# Patient Record
Sex: Female | Born: 1955 | Race: White | Hispanic: No | Marital: Married | Smoking: Never smoker
Health system: Southern US, Community
[De-identification: ages and names within clinical notes are randomized; demographics above are authoritative.]

## PROBLEM LIST (undated history)

## (undated) DIAGNOSIS — C801 Malignant (primary) neoplasm, unspecified: Secondary | ICD-10-CM

## (undated) DIAGNOSIS — D329 Benign neoplasm of meninges, unspecified: Secondary | ICD-10-CM

## (undated) HISTORY — PX: BREAST BIOPSY: SHX20

## (undated) HISTORY — DX: Benign neoplasm of meninges, unspecified: D32.9

## (undated) HISTORY — PX: OTHER SURGICAL HISTORY: SHX169

---

## 1988-12-16 HISTORY — PX: BREAST SURGERY: SHX581

## 2005-01-31 ENCOUNTER — Ambulatory Visit: Payer: Self-pay | Admitting: Internal Medicine

## 2006-03-12 ENCOUNTER — Ambulatory Visit: Payer: Self-pay | Admitting: Internal Medicine

## 2006-04-04 ENCOUNTER — Ambulatory Visit: Payer: Self-pay | Admitting: Obstetrics and Gynecology

## 2007-05-19 ENCOUNTER — Ambulatory Visit: Payer: Self-pay | Admitting: Internal Medicine

## 2007-05-28 ENCOUNTER — Ambulatory Visit: Payer: Self-pay | Admitting: Internal Medicine

## 2008-06-07 ENCOUNTER — Ambulatory Visit: Payer: Self-pay | Admitting: Internal Medicine

## 2008-07-27 ENCOUNTER — Ambulatory Visit: Payer: Self-pay | Admitting: Unknown Physician Specialty

## 2008-12-16 HISTORY — PX: OTHER SURGICAL HISTORY: SHX169

## 2009-06-28 ENCOUNTER — Ambulatory Visit: Payer: Self-pay | Admitting: Internal Medicine

## 2009-06-30 ENCOUNTER — Ambulatory Visit: Payer: Self-pay | Admitting: Obstetrics and Gynecology

## 2009-07-07 ENCOUNTER — Ambulatory Visit: Payer: Self-pay | Admitting: Obstetrics and Gynecology

## 2010-06-08 ENCOUNTER — Ambulatory Visit: Payer: Self-pay | Admitting: Internal Medicine

## 2010-06-08 ENCOUNTER — Emergency Department: Payer: Self-pay | Admitting: Emergency Medicine

## 2010-07-16 ENCOUNTER — Ambulatory Visit: Payer: Self-pay | Admitting: Neurosurgery

## 2010-09-04 ENCOUNTER — Ambulatory Visit: Payer: Self-pay | Admitting: Family Medicine

## 2011-01-31 ENCOUNTER — Ambulatory Visit: Payer: Self-pay | Admitting: Neurosurgery

## 2011-07-26 ENCOUNTER — Ambulatory Visit: Payer: Self-pay | Admitting: Internal Medicine

## 2011-09-11 ENCOUNTER — Ambulatory Visit: Payer: Self-pay | Admitting: Internal Medicine

## 2011-11-14 ENCOUNTER — Ambulatory Visit: Payer: Self-pay | Admitting: Obstetrics and Gynecology

## 2011-11-14 DIAGNOSIS — I059 Rheumatic mitral valve disease, unspecified: Secondary | ICD-10-CM

## 2011-11-15 ENCOUNTER — Ambulatory Visit: Payer: Self-pay | Admitting: Obstetrics and Gynecology

## 2011-11-18 LAB — PATHOLOGY REPORT

## 2012-05-14 ENCOUNTER — Ambulatory Visit: Payer: Self-pay | Admitting: Neurosurgery

## 2012-10-19 ENCOUNTER — Other Ambulatory Visit (HOSPITAL_COMMUNITY)
Admission: RE | Admit: 2012-10-19 | Discharge: 2012-10-19 | Disposition: A | Discharge status: Home / Self Care | Payer: BC Managed Care – PPO | Source: Ambulatory Visit | Attending: Internal Medicine | Admitting: Internal Medicine

## 2012-10-19 ENCOUNTER — Other Ambulatory Visit: Payer: Self-pay | Admitting: *Deleted

## 2012-10-19 ENCOUNTER — Ambulatory Visit (INDEPENDENT_AMBULATORY_CARE_PROVIDER_SITE_OTHER): Payer: BC Managed Care – PPO | Admitting: Internal Medicine

## 2012-10-19 ENCOUNTER — Encounter: Payer: Self-pay | Admitting: Internal Medicine

## 2012-10-19 VITALS — BP 98/62 | HR 60 | Temp 98.1°F | Ht 65.0 in | Wt 149.0 lb

## 2012-10-19 DIAGNOSIS — R351 Nocturia: Secondary | ICD-10-CM

## 2012-10-19 DIAGNOSIS — Z139 Encounter for screening, unspecified: Secondary | ICD-10-CM

## 2012-10-19 DIAGNOSIS — D320 Benign neoplasm of cerebral meninges: Secondary | ICD-10-CM

## 2012-10-19 DIAGNOSIS — R8290 Unspecified abnormal findings in urine: Secondary | ICD-10-CM

## 2012-10-19 DIAGNOSIS — Z01419 Encounter for gynecological examination (general) (routine) without abnormal findings: Secondary | ICD-10-CM

## 2012-10-19 DIAGNOSIS — Z1151 Encounter for screening for human papillomavirus (HPV): Secondary | ICD-10-CM | POA: Insufficient documentation

## 2012-10-19 DIAGNOSIS — R5383 Other fatigue: Secondary | ICD-10-CM

## 2012-10-19 DIAGNOSIS — D329 Benign neoplasm of meninges, unspecified: Secondary | ICD-10-CM

## 2012-10-19 DIAGNOSIS — R5381 Other malaise: Secondary | ICD-10-CM

## 2012-10-19 NOTE — Patient Instructions (Signed)
It was nice seeing you today.  I am glad you are doing well.  I am going to get your mammogram and labs scheduled.  We will notify you of your results once they are available to [place].

## 2012-10-20 ENCOUNTER — Encounter: Payer: Self-pay | Admitting: Internal Medicine

## 2012-10-20 DIAGNOSIS — D329 Benign neoplasm of meninges, unspecified: Secondary | ICD-10-CM | POA: Insufficient documentation

## 2012-10-20 NOTE — Progress Notes (Signed)
  Subjective:    Patient ID: Carla Horne, female    DOB: 09-25-1956, 56 y.o.   MRN: [id number]  HPI 56 year old female with past history of meningioma who comes in today for her complete physical exam.  States she has been doing well.  Retired three years ago.  Stays active.  Exercises.  No chest pain or tightness with increased activity or exertion.  Has some nocturia (occasionally), but overall bladder is doing relatively well.  No nausea or vomiting.  No bowel change.  Just saw Dr Haskell Riling.  States the current meningioma is stable.  Recommend follow up in one year.  Past Medical History  Diagnosis Date  . Meningioma     Review of Systems Patient denies any headache, lightheadedness or dizziness.  No sinus or allergy symptoms.  No chest pain, tightness or palpitations.  No increased shortness of breath, cough or congestion.  No acid reflux, dysphagia or odynophagia.  No nausea or vomiting.  No abdominal pain or cramping.  No bowel change, such as diarrhea, constipation, BRBPR or melana.  No urine change.   Did report some fatigue, but overall is doing well.      Objective:   Physical Exam Filed Vitals:   10/19/12 1339  BP: 98/62  Pulse: 60  Temp: 98.1 F (36.7 C)   Blood pressure recheck:  23/83  56 year old female in no acute distress.   HEENT:  Nares- clear.  Oropharynx - without lesions. NECK:  Supple.  Nontender.  No audible bruit.  HEART:  Appears to be regular. LUNGS:  No crackles or wheezing audible.  Respirations even and unlabored.  RADIAL PULSE:  Equal bilaterally.    BREASTS:  No nipple discharge or nipple retraction present.  Could not appreciate any distinct nodules or axillary adenopathy.  ABDOMEN:  Soft, nontender.  Bowel sounds present and normal.  No audible abdominal bruit.  GU:  Normal external genitalia.  Vaginal vault without lesions.  Cervix identified.  Pap performed. Could not appreciate any adnexal masses or tenderness.   RECTAL:  Heme negative.     EXTREMITIES:  No increased edema present.  DP pulses palpable and equal bilaterally.           Assessment & Plan:  FATIGUE.  Check cbc, met c and tsh.   NOCTURIA. Not a significant issue for her.  Will discuss with Dr Logan Bores regarding the mesh.  (had questions regarding the mesh material).  Check urinalysis.    HEALTH MAINTENANCE.  Physical today.  Pelvic and pap today.  Schedule her mammogram.  Check cholesterol.  States her last colonoscopy was approximately 2009.  Obtain records to review.

## 2012-10-20 NOTE — Assessment & Plan Note (Signed)
Sees Dr Nadeen Landau at [facility].  Just reevaluated and he felt the meningioma was stable.  Recommended follow up in one year.

## 2012-10-23 ENCOUNTER — Other Ambulatory Visit: Payer: Self-pay | Admitting: Internal Medicine

## 2012-10-23 ENCOUNTER — Other Ambulatory Visit (INDEPENDENT_AMBULATORY_CARE_PROVIDER_SITE_OTHER): Payer: BC Managed Care – PPO

## 2012-10-23 ENCOUNTER — Ambulatory Visit (INDEPENDENT_AMBULATORY_CARE_PROVIDER_SITE_OTHER)
Admission: RE | Admit: 2012-10-23 | Discharge: 2012-10-23 | Disposition: A | Discharge status: Home / Self Care | Payer: BC Managed Care – PPO | Source: Ambulatory Visit | Attending: Internal Medicine | Admitting: Internal Medicine

## 2012-10-23 DIAGNOSIS — R5383 Other fatigue: Secondary | ICD-10-CM

## 2012-10-23 DIAGNOSIS — R5381 Other malaise: Secondary | ICD-10-CM

## 2012-10-23 DIAGNOSIS — C439 Malignant melanoma of skin, unspecified: Secondary | ICD-10-CM

## 2012-10-23 DIAGNOSIS — R351 Nocturia: Secondary | ICD-10-CM

## 2012-10-23 DIAGNOSIS — Z139 Encounter for screening, unspecified: Secondary | ICD-10-CM

## 2012-10-23 LAB — LIPID PANEL
LDL Cholesterol: 106 mg/dL — ABNORMAL HIGH (ref 0–99)
Total CHOL/HDL Ratio: 3
Triglycerides: 49 mg/dL (ref 0.0–149.0)

## 2012-10-23 LAB — URINALYSIS, ROUTINE W REFLEX MICROSCOPIC
Nitrite: NEGATIVE
Specific Gravity, Urine: 1.015 (ref 1.000–1.030)
Total Protein, Urine: NEGATIVE
pH: 8 (ref 5.0–8.0)

## 2012-10-23 LAB — CBC WITH DIFFERENTIAL/PLATELET
Basophils Absolute: 0 10*3/uL (ref 0.0–0.1)
Eosinophils Absolute: 0.1 10*3/uL (ref 0.0–0.7)
Hemoglobin: 14.6 g/dL (ref 12.0–15.0)
Lymphocytes Relative: 33.9 % (ref 12.0–46.0)
MCHC: 32.8 g/dL (ref 30.0–36.0)
Monocytes Relative: 9.5 % (ref 3.0–12.0)
Neutro Abs: 2.3 10*3/uL (ref 1.4–7.7)
Neutrophils Relative %: 53.2 % (ref 43.0–77.0)
Platelets: 210 10*3/uL (ref 150.0–400.0)
RDW: 13.6 % (ref 11.5–14.6)

## 2012-10-23 LAB — COMPREHENSIVE METABOLIC PANEL
ALT: 21 U/L (ref 0–35)
AST: 27 U/L (ref 0–37)
Albumin: 4.1 g/dL (ref 3.5–5.2)
CO2: 29 mEq/L (ref 19–32)
Calcium: 9.2 mg/dL (ref 8.4–10.5)
Chloride: 104 mEq/L (ref 96–112)
GFR: 80.2 mL/min (ref >60.00)
Potassium: 5 mEq/L (ref 3.5–5.1)
Sodium: 139 mEq/L (ref 135–145)
Total Protein: 7 g/dL (ref 6.0–8.3)

## 2012-10-24 ENCOUNTER — Other Ambulatory Visit: Payer: Self-pay | Admitting: Internal Medicine

## 2012-10-24 DIAGNOSIS — D72819 Decreased white blood cell count, unspecified: Secondary | ICD-10-CM

## 2012-11-02 ENCOUNTER — Ambulatory Visit: Payer: Self-pay | Admitting: Internal Medicine

## 2012-11-03 ENCOUNTER — Telehealth: Payer: Self-pay | Admitting: Internal Medicine

## 2012-11-03 NOTE — Telephone Encounter (Signed)
Please notify pt mammo ok.

## 2012-11-04 ENCOUNTER — Other Ambulatory Visit (INDEPENDENT_AMBULATORY_CARE_PROVIDER_SITE_OTHER): Payer: BC Managed Care – PPO

## 2012-11-04 DIAGNOSIS — D72819 Decreased white blood cell count, unspecified: Secondary | ICD-10-CM

## 2012-11-04 DIAGNOSIS — R17 Unspecified jaundice: Secondary | ICD-10-CM

## 2012-11-04 LAB — CBC WITH DIFFERENTIAL/PLATELET
Basophils Relative: 0.2 % (ref 0.0–3.0)
Eosinophils Absolute: 0.1 10*3/uL (ref 0.0–0.7)
Eosinophils Relative: 1.4 % (ref 0.0–5.0)
Hemoglobin: 14.6 g/dL (ref 12.0–15.0)
MCHC: 32.9 g/dL (ref 30.0–36.0)
MCV: 95.4 fl (ref 78.0–100.0)
Monocytes Absolute: 0.6 10*3/uL (ref 0.1–1.0)
Neutro Abs: 3.2 10*3/uL (ref 1.4–7.7)
Neutrophils Relative %: 59.8 % (ref 43.0–77.0)
RBC: 4.66 Mil/uL (ref 3.87–5.11)
WBC: 5.3 10*3/uL (ref 4.5–10.5)

## 2012-11-04 LAB — HEPATIC FUNCTION PANEL
Bilirubin, Direct: 0.3 mg/dL (ref 0.0–0.3)
Total Bilirubin: 1.8 mg/dL — ABNORMAL HIGH (ref 0.3–1.2)

## 2012-11-04 NOTE — Telephone Encounter (Signed)
Called and gave lab results to patient.  

## 2012-11-11 ENCOUNTER — Telehealth: Payer: Self-pay | Admitting: Internal Medicine

## 2012-11-11 ENCOUNTER — Other Ambulatory Visit: Payer: Self-pay | Admitting: Internal Medicine

## 2012-11-11 NOTE — Telephone Encounter (Signed)
Pt coming in for labs labs on 11/20/12 (11:00).   She is aware.  Please put on schedule.  Thanks.

## 2012-11-11 NOTE — Progress Notes (Signed)
Abdominal ultrasound ordered and follow up liver panel ordered.

## 2012-11-11 NOTE — Telephone Encounter (Signed)
Put on schedule

## 2012-11-16 ENCOUNTER — Encounter: Payer: Self-pay | Admitting: Internal Medicine

## 2012-11-18 ENCOUNTER — Telehealth: Payer: Self-pay | Admitting: Internal Medicine

## 2012-11-18 ENCOUNTER — Ambulatory Visit: Payer: Self-pay | Admitting: Internal Medicine

## 2012-11-18 NOTE — Telephone Encounter (Signed)
Notify pt abdominal ultrasound is negative.  Liver normal.  Ok.

## 2012-11-19 NOTE — Telephone Encounter (Signed)
Informed patient of abdominal ultrasound and , Both okay

## 2012-11-20 ENCOUNTER — Other Ambulatory Visit: Payer: BC Managed Care – PPO

## 2012-11-30 ENCOUNTER — Encounter: Payer: Self-pay | Admitting: Internal Medicine

## 2012-12-07 ENCOUNTER — Other Ambulatory Visit (INDEPENDENT_AMBULATORY_CARE_PROVIDER_SITE_OTHER): Payer: BC Managed Care – PPO

## 2012-12-07 DIAGNOSIS — R17 Unspecified jaundice: Secondary | ICD-10-CM

## 2012-12-07 LAB — HEPATIC FUNCTION PANEL
Albumin: 4.2 g/dL (ref 3.5–5.2)
Total Protein: 6.8 g/dL (ref 6.0–8.3)

## 2012-12-14 ENCOUNTER — Telehealth: Payer: Self-pay | Admitting: Internal Medicine

## 2012-12-14 NOTE — Telephone Encounter (Signed)
Left message for pt to call office.  Pt needs 5/14 follow up with dr Lorin Picket

## 2012-12-14 NOTE — Telephone Encounter (Signed)
Patient left voice mail on CMA's voicemail stating she was returning a call from our office.

## 2012-12-15 ENCOUNTER — Telehealth: Payer: Self-pay | Admitting: Internal Medicine

## 2012-12-15 NOTE — Telephone Encounter (Signed)
Opened error

## 2012-12-17 NOTE — Telephone Encounter (Signed)
Carla Horne made appointment 5/2 pt aware of appointment

## 2012-12-17 NOTE — Telephone Encounter (Signed)
Left message for pt to call office

## 2013-04-16 ENCOUNTER — Ambulatory Visit (INDEPENDENT_AMBULATORY_CARE_PROVIDER_SITE_OTHER): Payer: BC Managed Care – PPO | Admitting: Internal Medicine

## 2013-04-16 ENCOUNTER — Ambulatory Visit: Payer: BC Managed Care – PPO | Admitting: Internal Medicine

## 2013-04-16 ENCOUNTER — Encounter: Payer: Self-pay | Admitting: Internal Medicine

## 2013-04-16 VITALS — BP 90/60 | HR 61 | Temp 98.6°F | Ht 65.0 in | Wt 149.6 lb

## 2013-04-16 DIAGNOSIS — R17 Unspecified jaundice: Secondary | ICD-10-CM

## 2013-04-16 DIAGNOSIS — D329 Benign neoplasm of meninges, unspecified: Secondary | ICD-10-CM

## 2013-04-16 DIAGNOSIS — D320 Benign neoplasm of cerebral meninges: Secondary | ICD-10-CM

## 2013-04-16 NOTE — Progress Notes (Signed)
  Subjective:    Patient ID: Carla Horne, female    DOB: 1956/08/12, 57 y.o.   MRN: [id number]  HPI 57 year old female with past history of meningioma who comes in today for a scheduled follow up.  States she has been doing well.  Retired three years ago.  Stays active.  Exercises.  No chest pain or tightness with increased activity or exertion.  No nausea or vomiting.  No bowel change.  Due to see Dr Haskell Riling in one month.  Everything has been stable.   States the current meningioma is stable.     Past Medical History  Diagnosis Date  . Meningioma     Review of Systems Patient denies any headache, lightheadedness or dizziness.  No sinus or allergy symptoms.  No chest pain, tightness or palpitations.  No increased shortness of breath, cough or congestion.  No acid reflux, dysphagia or odynophagia.  No nausea or vomiting.  No abdominal pain or cramping.  No bowel change, such as diarrhea, constipation, BRBPR or melana.  No urine change.       Objective:   Physical Exam  Filed Vitals:   04/16/13 0836  BP: 90/60  Pulse: 61  Temp: 98.6 F (37 C)   Blood pressure recheck:  118/72 right arm and 118/68 in left arm  57 year old female in no acute distress.   HEENT:  Nares- clear.  Oropharynx - without lesions. NECK:  Supple.  Nontender.  No audible bruit.  HEART:  Appears to be regular. LUNGS:  No crackles or wheezing audible.  Respirations even and unlabored.  RADIAL PULSE:  Equal bilaterally.  ABDOMEN:  Soft, nontender.  Bowel sounds present and normal.  No audible abdominal bruit.    EXTREMITIES:  No increased edema present.  DP pulses palpable and equal bilaterally.           Assessment & Plan:  HEALTH MAINTENANCE.  Physical 10/19/12.  Pap at physical ok.  Mammogram 11/02/12 - Birads II.  States her last colonoscopy was approximately 2009.

## 2013-04-18 ENCOUNTER — Encounter: Payer: Self-pay | Admitting: Internal Medicine

## 2013-04-18 NOTE — Assessment & Plan Note (Signed)
Abdominal ultrasound negative.  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  Last bilirubin check wnl.

## 2013-04-18 NOTE — Assessment & Plan Note (Signed)
Sees Dr Nadeen Landau at [facility].  He felt the meningioma was stable.  Recommended follow up in one year.  She is due to see him next month.

## 2013-05-21 ENCOUNTER — Encounter: Payer: Self-pay | Admitting: Internal Medicine

## 2013-06-04 IMAGING — MG MM CAD SCREENING MAMMO
1 series · 4 of 4 positions shown · non-contrast
Comparison: none

REASON FOR EXAM: SCR MAMMO NO ORDER
COMMENTS:

[R CC · right · 4 of 4 slices shown]
[im 1/4]
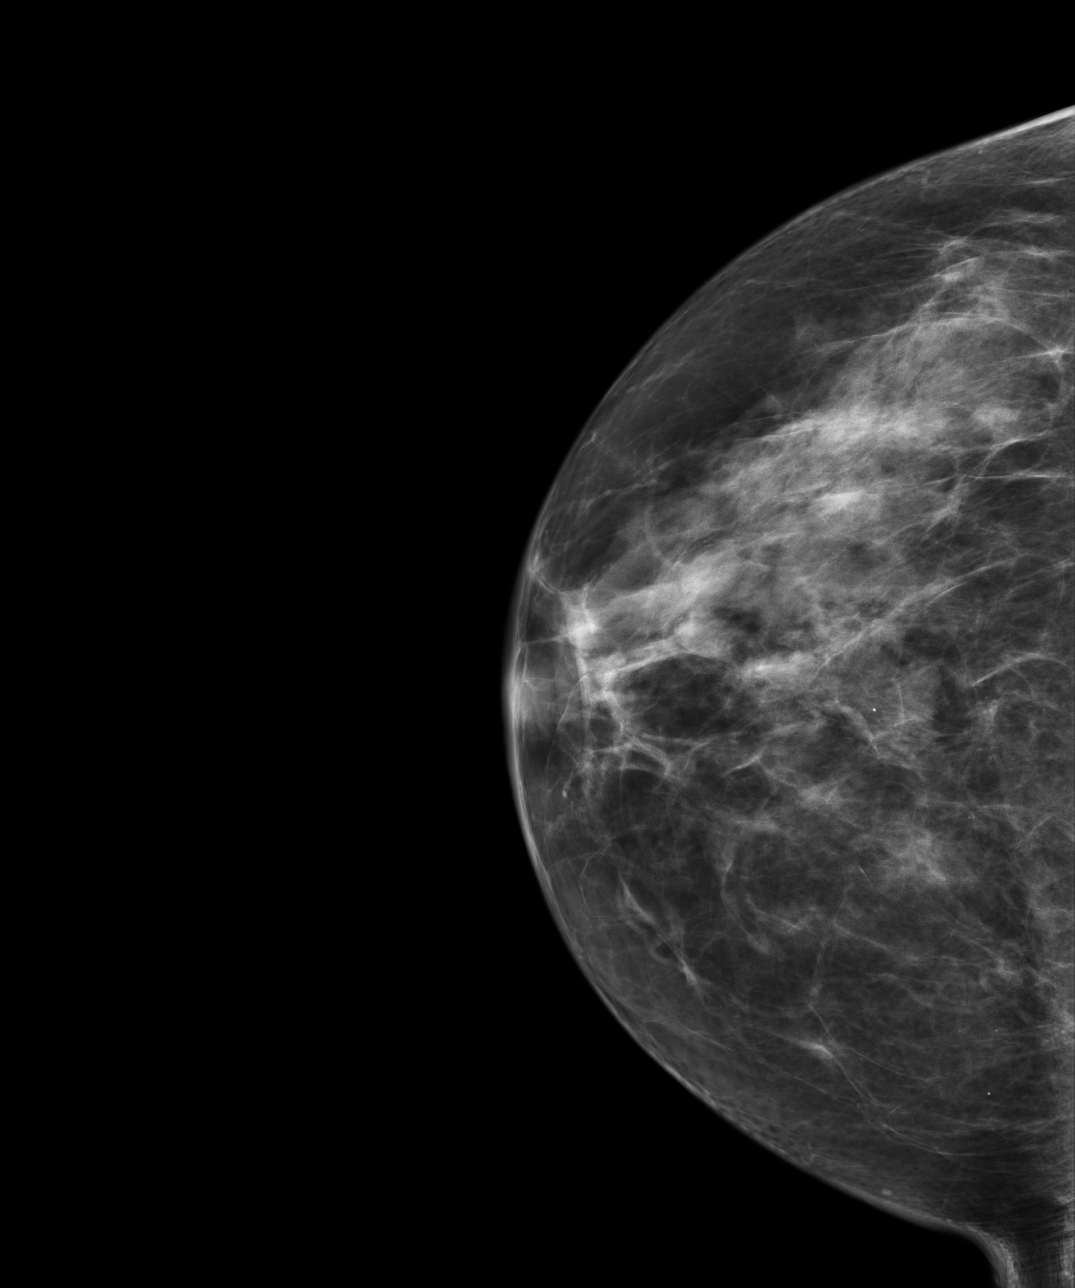
[im 2/4]
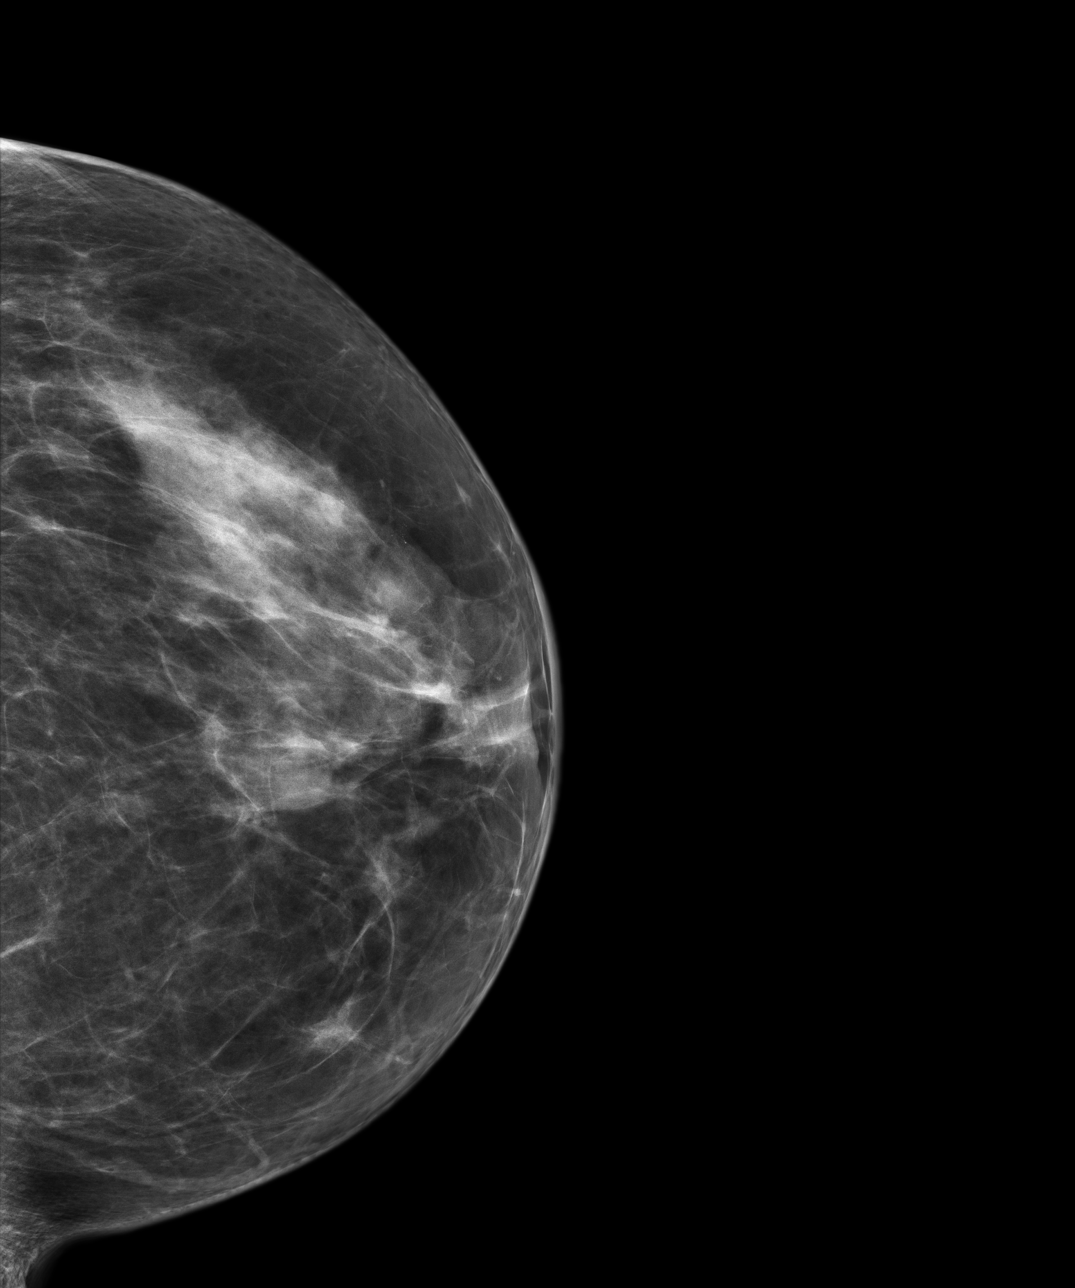
[im 3/4]
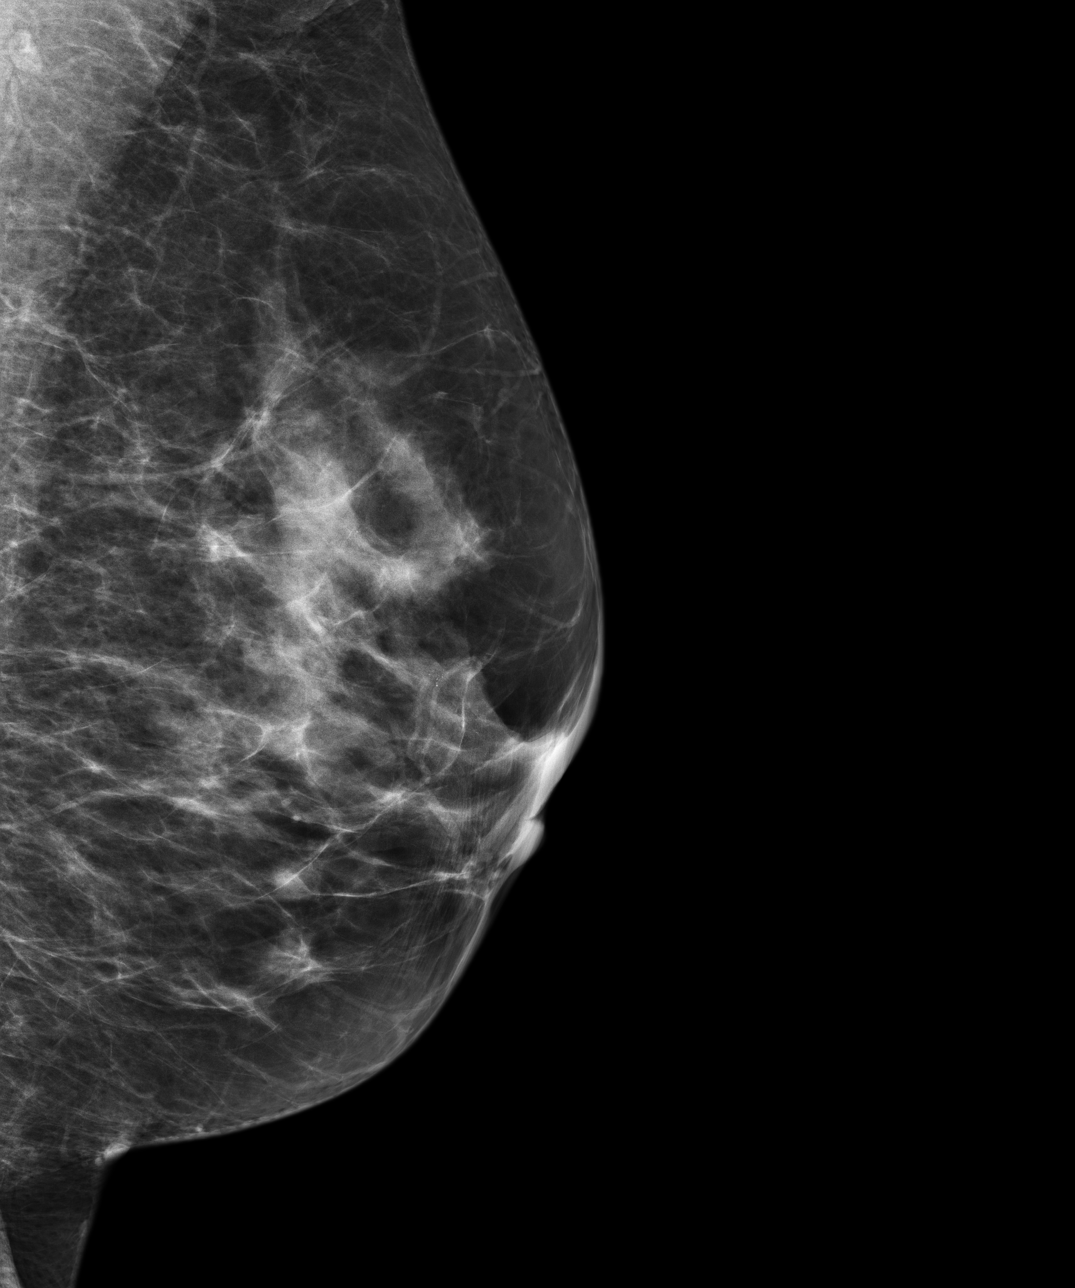
[im 4/4]
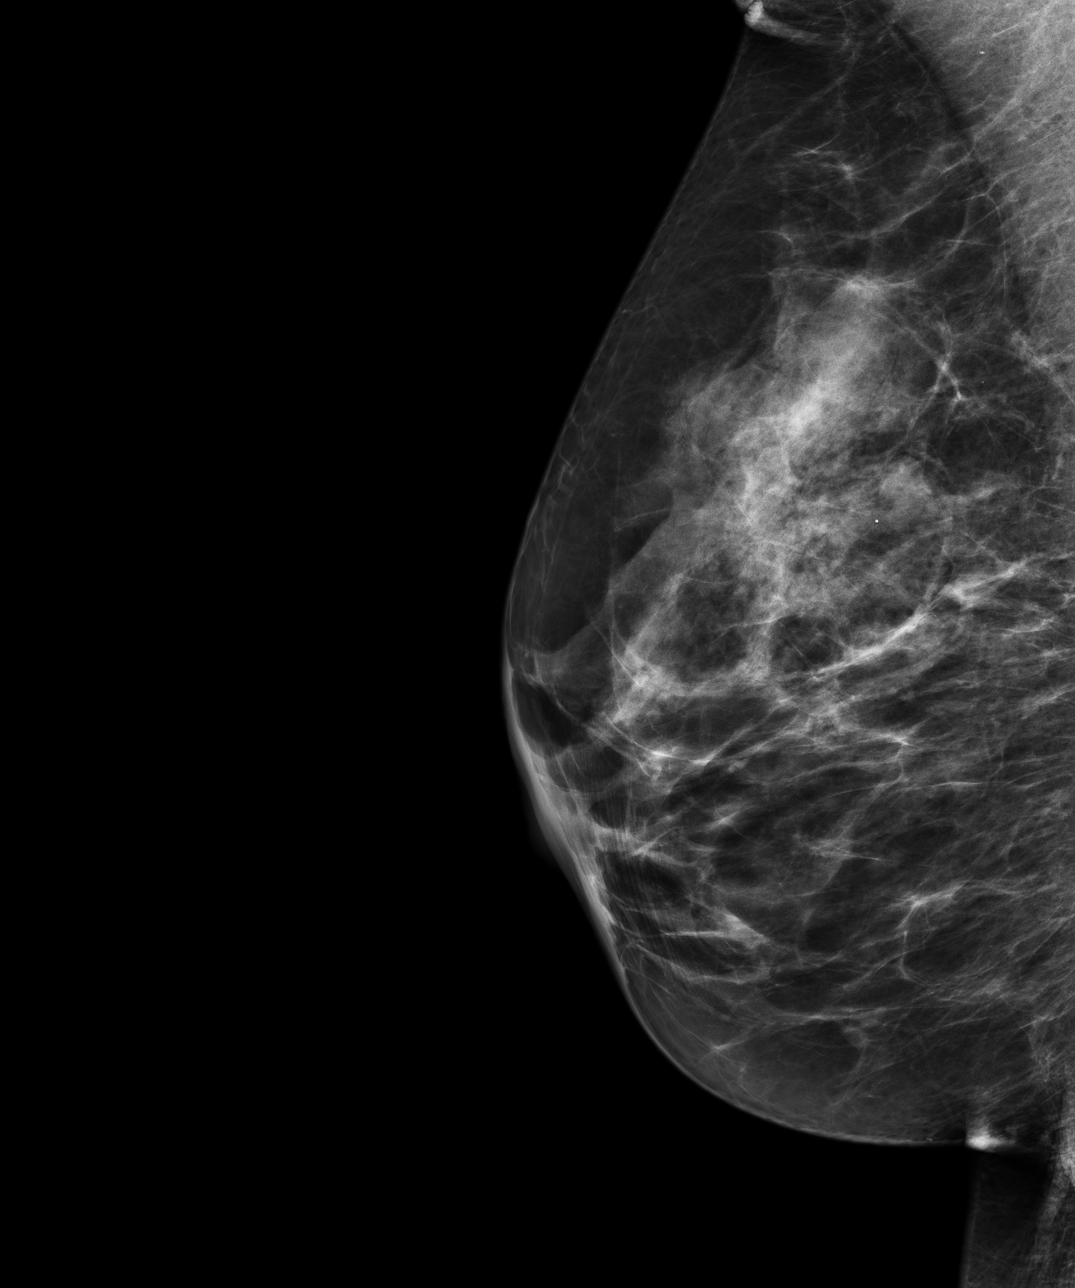

[4 of 4 positions shown; findings below may reference images not displayed]

PROCEDURE:     MAM - MAM DGTL SCRN MAM NO ORDER W/CAD  - November 02, 2012  [DATE]

RESULT:     There is a family history of breast cancer in the patient's
paternal grandmother. The patient has a history of melanoma and a previous
negative left breast biopsy 15 years ago. Comparison is made to previous
digital mammographic images including the study dated [DATE] [DATE] [DATE], [DATE]
[DATE] [DATE], [DATE] [DATE] [DATE] and 31 January, 2005. The parenchymal density
has decreased over time with a moderate retained parenchymal pattern. There
is no dominant mass, malignant calcification or architectural distortion.
The mammographic appearance is stable.
IMPRESSION: 1. Stable, benign appearing bilateral mammogram.

BI-RADS: Category 2 - Benign Finding.

Please continue to encourage annual mammographic follow-up and monthly
breast self exam.

A NEGATIVE MAMMOGRAM REPORT DOES NOT PRECLUDE BIOPSY OR OTHER EVALUATION OF
A CLINICALLY PALPABLE OR OTHERWISE SUSPICIOUS MASS OR LESION. BREAST CANCER
MAY NOT BE DETECTED BY MAMMOGRAPHY IN UP TO 10% OF CASES.

[REDACTED]

## 2013-06-20 IMAGING — US ABDOMEN ULTRASOUND LIMITED
1 series · 14 of 25 positions shown · non-contrast
Comparison: none

REASON FOR EXAM: hyperbilirubanemia
COMMENTS:

[Series 1: abdomen ultrasound limited · 0.21mm/px · 14 of 96 slices shown]
[im 1/96]
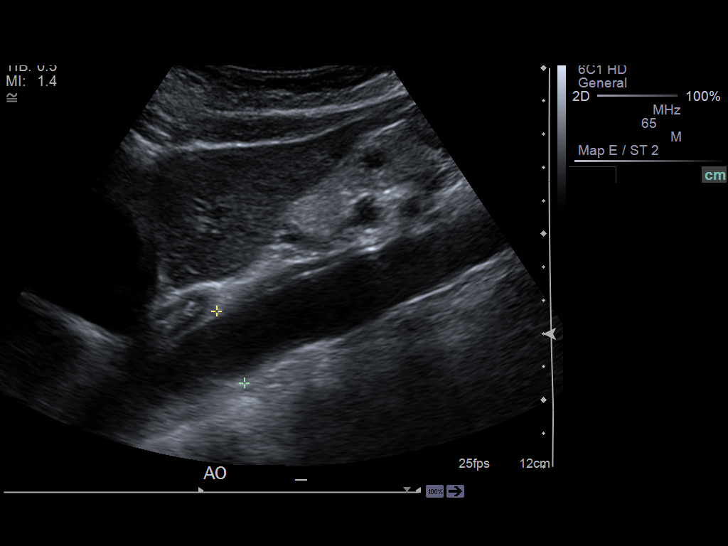
[im 8/96]
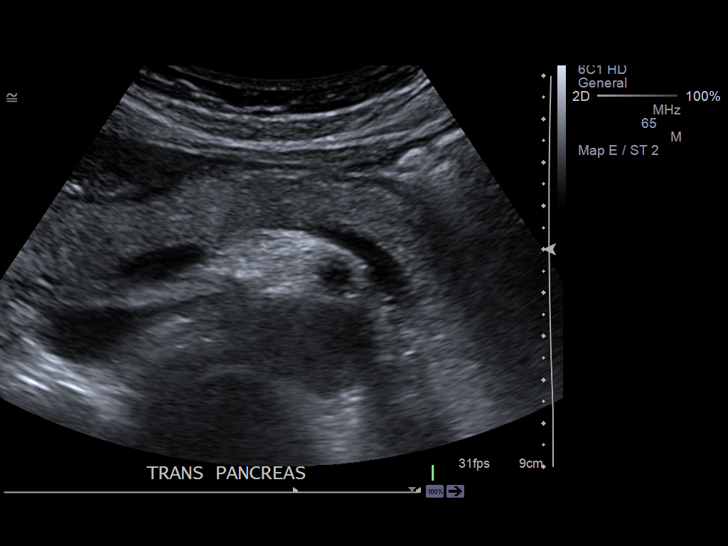
[im 16/96]
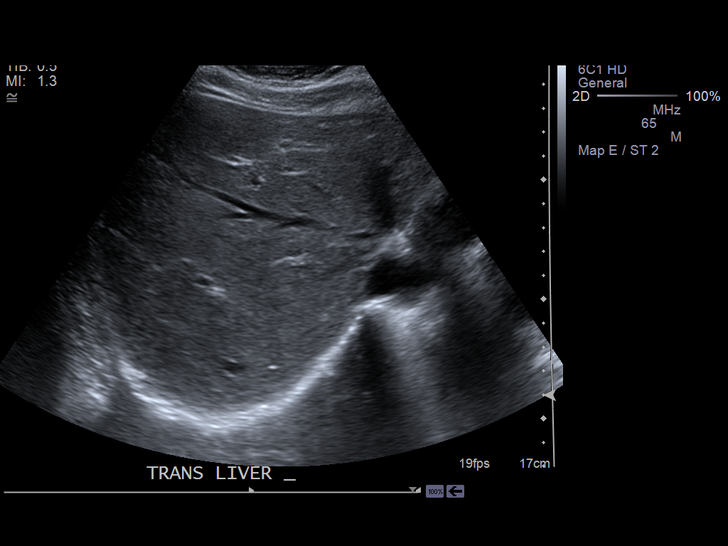
[im 24/96]
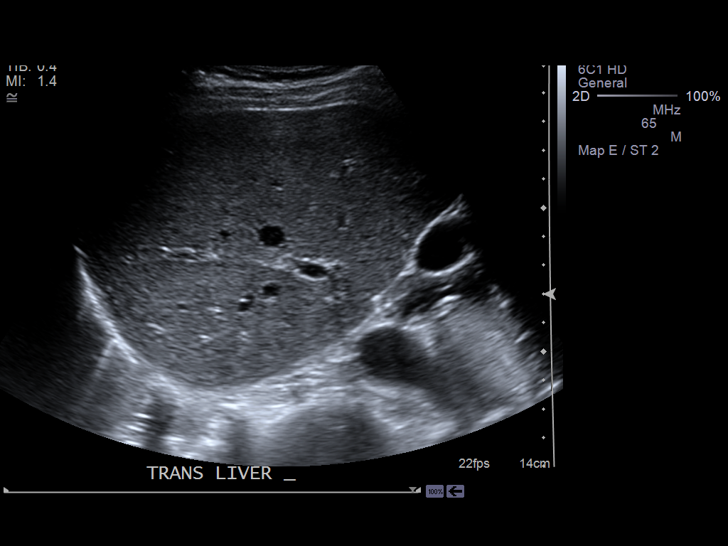
[im 32/96]
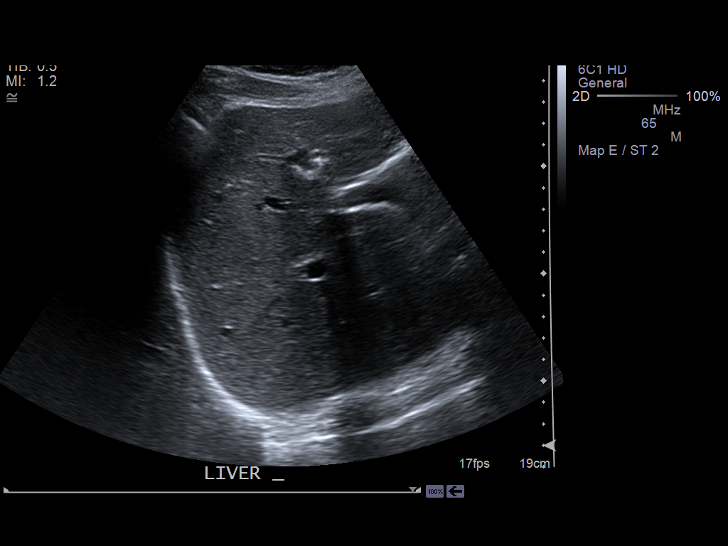
[im 36/96]
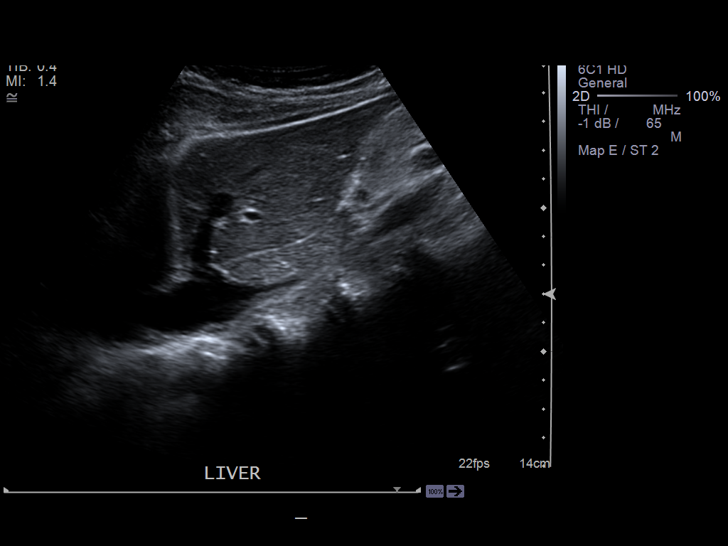
[im 44/96]
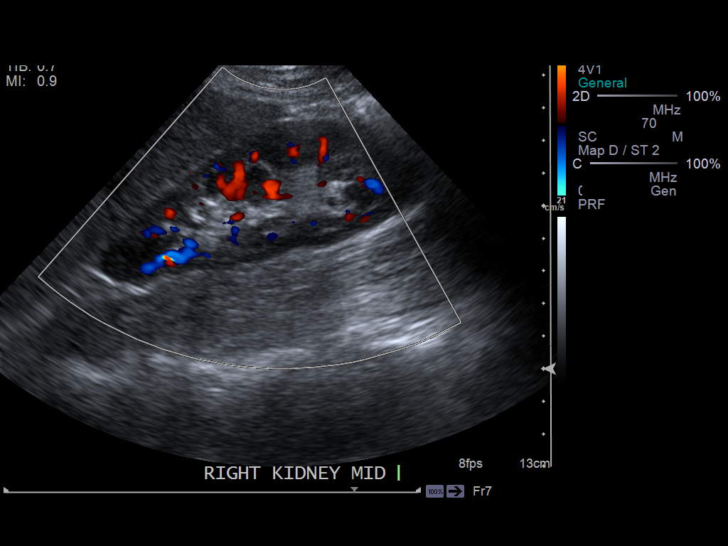
[im 52/96]
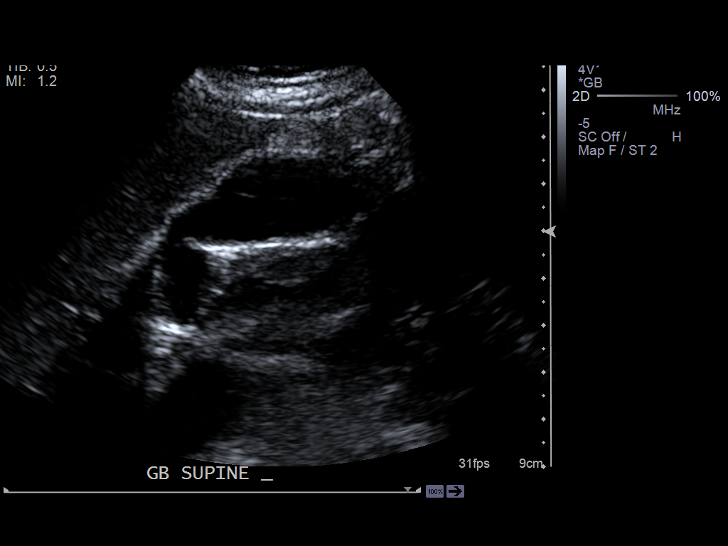
[im 60/96]
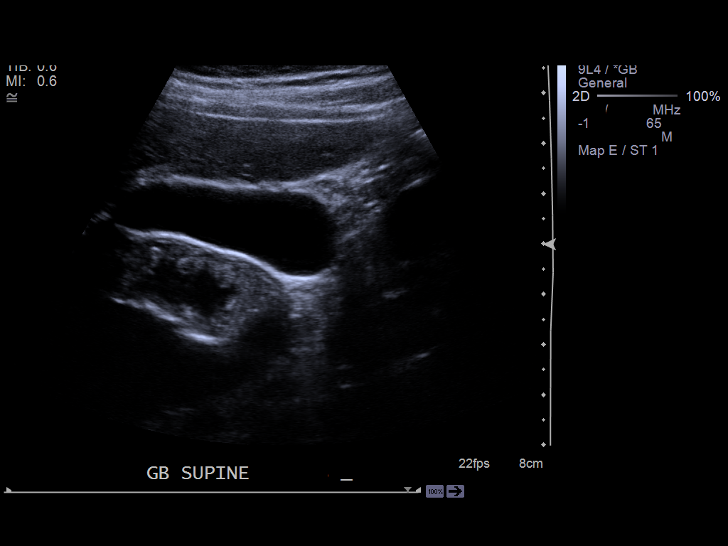
[im 64/96]
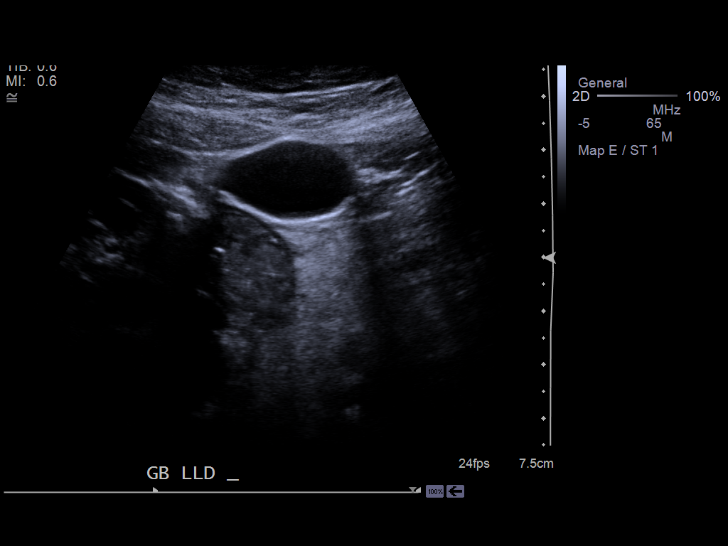
[im 72/96]
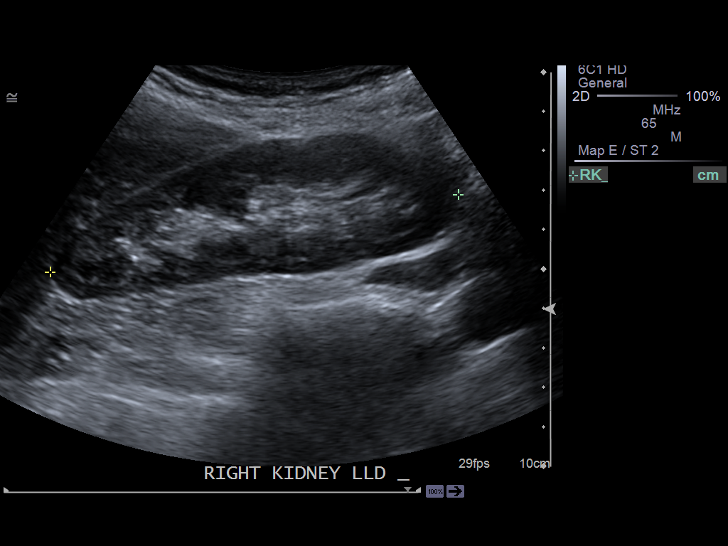
[im 80/96]
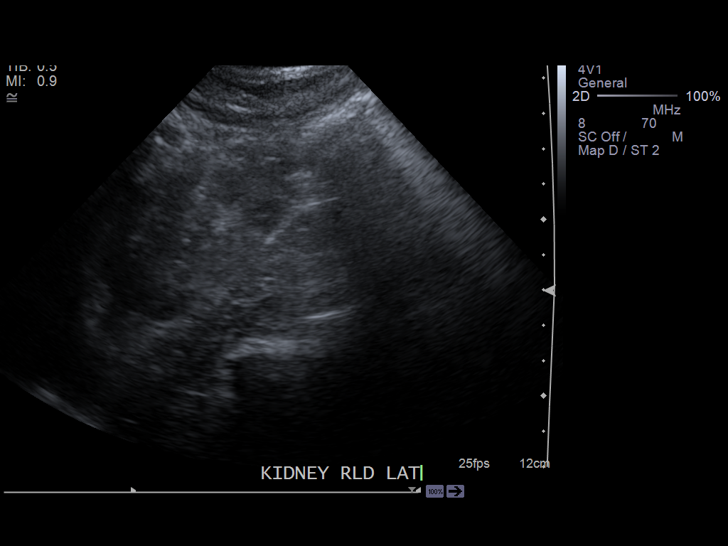
[im 88/96]
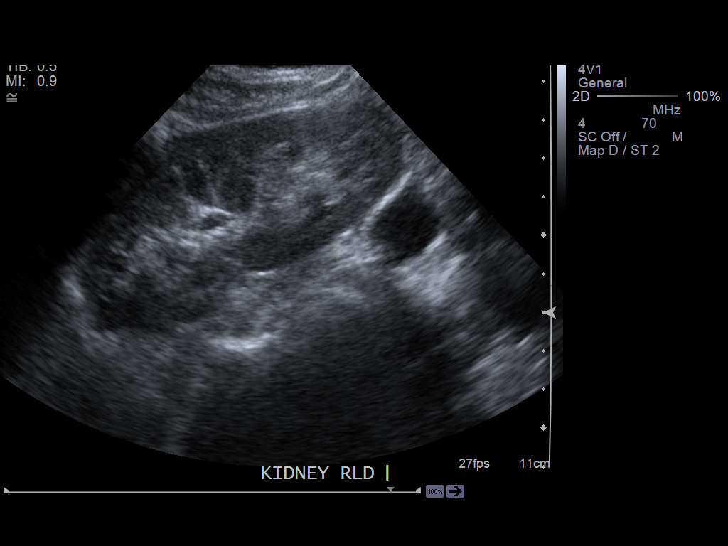
[im 96/96]
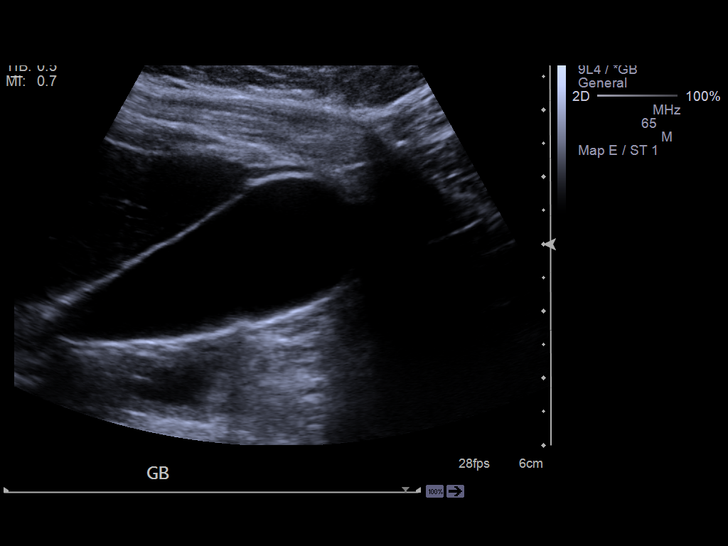

[14 of 25 positions shown; findings below may reference images not displayed]

PROCEDURE:     NIMA - NIMA ABDOMEN UPPER GENERAL  - November 18, 2012  [DATE]

RESULT:     The liver exhibits normal echotexture with no focal mass or
ductal dilation. Portal venous flow is normal in direction toward the liver.
The gallbladder is adequately distended with no evidence of stones, wall
thickening, or pericholecystic fluid. The common bile duct is normal at
mm in diameter. Bowel gas limited evaluation of the pancreas. The spleen and
kidneys are normal in appearance. The abdominal aorta is normal in caliber.
IMPRESSION: 1. Evaluation of pancreas is limited by bowel gas.
2. The remainder of the visualized abdominal viscera appear normal.

[REDACTED]

## 2013-10-26 ENCOUNTER — Encounter (INDEPENDENT_AMBULATORY_CARE_PROVIDER_SITE_OTHER): Payer: Self-pay

## 2013-10-26 ENCOUNTER — Encounter: Payer: Self-pay | Admitting: Internal Medicine

## 2013-10-26 ENCOUNTER — Ambulatory Visit (INDEPENDENT_AMBULATORY_CARE_PROVIDER_SITE_OTHER): Payer: BC Managed Care – PPO | Admitting: Internal Medicine

## 2013-10-26 DIAGNOSIS — D329 Benign neoplasm of meninges, unspecified: Secondary | ICD-10-CM

## 2013-10-26 DIAGNOSIS — K219 Gastro-esophageal reflux disease without esophagitis: Secondary | ICD-10-CM

## 2013-10-26 DIAGNOSIS — Z1211 Encounter for screening for malignant neoplasm of colon: Secondary | ICD-10-CM

## 2013-10-26 DIAGNOSIS — D320 Benign neoplasm of cerebral meninges: Secondary | ICD-10-CM

## 2013-10-26 DIAGNOSIS — Z9109 Other allergy status, other than to drugs and biological substances: Secondary | ICD-10-CM

## 2013-10-26 DIAGNOSIS — Z13220 Encounter for screening for lipoid disorders: Secondary | ICD-10-CM

## 2013-10-26 DIAGNOSIS — R17 Unspecified jaundice: Secondary | ICD-10-CM

## 2013-10-26 LAB — CBC WITH DIFFERENTIAL/PLATELET
Basophils Relative: 0.8 % (ref 0.0–3.0)
Eosinophils Relative: 3.3 % (ref 0.0–5.0)
HCT: 44.3 % (ref 36.0–46.0)
Lymphs Abs: 1.3 10*3/uL (ref 0.7–4.0)
MCV: 93.6 fl (ref 78.0–100.0)
Monocytes Absolute: 0.4 10*3/uL (ref 0.1–1.0)
Monocytes Relative: 8.5 % (ref 3.0–12.0)
Neutro Abs: 3.3 10*3/uL (ref 1.4–7.7)
Platelets: 221 10*3/uL (ref 150.0–400.0)
RBC: 4.74 Mil/uL (ref 3.87–5.11)
WBC: 5.2 10*3/uL (ref 4.5–10.5)

## 2013-10-26 LAB — LIPID PANEL
Cholesterol: 216 mg/dL — ABNORMAL HIGH (ref 0–200)
HDL: 67.9 mg/dL (ref >39.00)
Triglycerides: 57 mg/dL (ref 0.0–149.0)
VLDL: 11.4 mg/dL (ref 0.0–40.0)

## 2013-10-26 LAB — COMPREHENSIVE METABOLIC PANEL
Alkaline Phosphatase: 72 U/L (ref 39–117)
BUN: 11 mg/dL (ref 6–23)
Creatinine, Ser: 0.8 mg/dL (ref 0.4–1.2)
Glucose, Bld: 83 mg/dL (ref 70–99)
Total Bilirubin: 1.7 mg/dL — ABNORMAL HIGH (ref 0.3–1.2)

## 2013-10-26 LAB — TSH: TSH: 0.78 u[IU]/mL (ref 0.35–5.50)

## 2013-10-26 MED ORDER — FLUTICASONE PROPIONATE 50 MCG/ACT NA SUSP: 2.0000 | Freq: Every day | NASAL | Status: DC

## 2013-10-26 MED ORDER — ESOMEPRAZOLE MAGNESIUM 40 MG PO CPDR: 40.0000 mg | DELAYED_RELEASE_CAPSULE | Freq: Every day | ORAL | Status: DC

## 2013-10-26 NOTE — Progress Notes (Signed)
  Subjective:    Patient ID: Carla Horne, female    DOB: Dec 28, 1955, 57 y.o.   MRN: [id number]  HPI 57 year old female with past history of meningioma who comes in today for her physical exam.   States she has been doing well.  Retired.  Stays active.  Exercises.  No chest pain or tightness with increased activity or exertion.  No nausea or vomiting.  No bowel change.  Followed by Dr Haskell Riling.   Everything has been stable.   States the current meningioma is stable.  Has f/u with him next week.  She does report her left ear is stopped up.  No pain.  Some change in hearing.  Some increased drainage.  No chest congestion or cough.  Does report some acid reflux.  Throat burning.  Notices mostly after eating certain foods (like [place]).  This started 4-5 months ago.  Has been taking Tums or Rolaids.  Some increased burping.  No abdominal pain.  No nausea or vomiting.  No bowel change.     Past Medical History  Diagnosis Date  . Meningioma     Review of Systems Patient denies any headache, lightheadedness or dizziness.  Left ear fullness as outlined.  Feels stopped up.  Increased drainage.  No chest pain, tightness or palpitations.  No increased shortness of breath, cough or congestion.  Does report some increased acid reflux as outlined.  No dysphagia or odynophagia.  No nausea or vomiting.  No abdominal pain or cramping.  No bowel change, such as diarrhea, constipation, BRBPR or melana.  No urine change.  Exercising.  Overall she feels she is doing relatively well.       Objective:   Physical Exam  Filed Vitals:   10/26/13 0844  BP: 112/70  Pulse: 60  Temp: 98.5 F (61.80 C)   57 year old female in no acute distress.   HEENT:  Nares- clear.  Oropharynx - without lesions. NECK:  Supple.  Nontender.  No audible bruit.  HEART:  Appears to be regular. LUNGS:  No crackles or wheezing audible.  Respirations even and unlabored.  RADIAL PULSE:  Equal bilaterally.    BREASTS:  No nipple discharge or  nipple retraction present.  Could not appreciate any distinct nodules or axillary adenopathy.  ABDOMEN:  Soft, nontender.  Bowel sounds present and normal.  No audible abdominal bruit.  GU:  Not performed.     EXTREMITIES:  No increased edema present.  DP pulses palpable and equal bilaterally.           Assessment & Plan:  HEALTH MAINTENANCE.  Physical 10/19/12.  Pap at physical ok.  No need to repeat pap this year.  Mammogram 11/02/12 - Birads II.  Schedule f/u mammogram.  States her last colonoscopy was approximately 2009.  Will need to contact GI - when due follow up colonoscopy.  IFOB today.

## 2013-10-26 NOTE — Patient Instructions (Signed)
Afrin nasal spray - 2 sprays each nostril 2x/day for 3 days only.  Flonase nasal spray - 2 sprays each nostril one time per day.  Do in the evening.   Take nexium - one per day.  Take in the morning (30 minutes before eating).

## 2013-10-27 ENCOUNTER — Other Ambulatory Visit: Payer: Self-pay | Admitting: Internal Medicine

## 2013-10-27 DIAGNOSIS — E875 Hyperkalemia: Secondary | ICD-10-CM

## 2013-10-27 NOTE — Progress Notes (Signed)
Orders placed for f/u labs.  

## 2013-10-28 ENCOUNTER — Other Ambulatory Visit (INDEPENDENT_AMBULATORY_CARE_PROVIDER_SITE_OTHER): Payer: BC Managed Care – PPO

## 2013-10-28 ENCOUNTER — Ambulatory Visit: Payer: Self-pay | Admitting: Neurosurgery

## 2013-10-28 DIAGNOSIS — R17 Unspecified jaundice: Secondary | ICD-10-CM

## 2013-10-28 DIAGNOSIS — E875 Hyperkalemia: Secondary | ICD-10-CM

## 2013-10-28 LAB — HEPATIC FUNCTION PANEL
ALT: 17 U/L (ref 0–35)
Albumin: 4.2 g/dL (ref 3.5–5.2)
Total Protein: 7.1 g/dL (ref 6.0–8.3)

## 2013-10-29 ENCOUNTER — Encounter: Payer: Self-pay | Admitting: *Deleted

## 2013-10-30 ENCOUNTER — Encounter: Payer: Self-pay | Admitting: Internal Medicine

## 2013-10-30 DIAGNOSIS — K219 Gastro-esophageal reflux disease without esophagitis: Secondary | ICD-10-CM | POA: Insufficient documentation

## 2013-10-30 DIAGNOSIS — Z9109 Other allergy status, other than to drugs and biological substances: Secondary | ICD-10-CM | POA: Insufficient documentation

## 2013-10-30 NOTE — Assessment & Plan Note (Signed)
Abdominal ultrasound negative.  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  Last bilirubin check wnl.  Follow.   

## 2013-10-30 NOTE — Assessment & Plan Note (Signed)
Has noticed over the last several months.  Start nexium 40mg  q day as directed.  Follow.  Get her back in soon to reassess.

## 2013-10-30 NOTE — Assessment & Plan Note (Signed)
Saline nasal spray as directed.  Flonase as directed.  Follow.  If symptoms persist or do not resolve - needs reevaluation.

## 2013-10-30 NOTE — Assessment & Plan Note (Signed)
Sees Dr Nadeen Landau at [facility].  He felt the meningioma was stable.  Due follow up next week.

## 2013-11-03 ENCOUNTER — Telehealth: Payer: Self-pay | Admitting: Internal Medicine

## 2013-11-03 NOTE — Telephone Encounter (Signed)
Please notify pt that I did talk to GI and she is due for a f/u colonoscopy.  Was due f/u in 07/2013.  Let me know if she is agreeable for referral and I will place the order.

## 2013-11-04 NOTE — Telephone Encounter (Signed)
Noted  

## 2013-11-04 NOTE — Telephone Encounter (Signed)
Pt notified & stated that she would rather call herself to schedule to colonoscopy.

## 2013-11-04 NOTE — Telephone Encounter (Signed)
LMTCB

## 2013-11-15 ENCOUNTER — Ambulatory Visit: Payer: Self-pay | Admitting: Internal Medicine

## 2013-11-16 ENCOUNTER — Encounter: Payer: Self-pay | Admitting: Internal Medicine

## 2013-11-16 DIAGNOSIS — Z86010 Personal history of colonic polyps: Secondary | ICD-10-CM

## 2013-12-28 ENCOUNTER — Ambulatory Visit (INDEPENDENT_AMBULATORY_CARE_PROVIDER_SITE_OTHER): Payer: BC Managed Care – PPO | Admitting: Internal Medicine

## 2013-12-28 ENCOUNTER — Encounter: Payer: Self-pay | Admitting: Internal Medicine

## 2013-12-28 ENCOUNTER — Encounter (INDEPENDENT_AMBULATORY_CARE_PROVIDER_SITE_OTHER): Payer: Self-pay

## 2013-12-28 VITALS — BP 120/80 | HR 60 | Temp 98.6°F | Ht 65.0 in | Wt 157.0 lb

## 2013-12-28 DIAGNOSIS — Z9109 Other allergy status, other than to drugs and biological substances: Secondary | ICD-10-CM

## 2013-12-28 DIAGNOSIS — D320 Benign neoplasm of cerebral meninges: Secondary | ICD-10-CM

## 2013-12-28 DIAGNOSIS — R17 Unspecified jaundice: Secondary | ICD-10-CM

## 2013-12-28 DIAGNOSIS — D329 Benign neoplasm of meninges, unspecified: Secondary | ICD-10-CM

## 2013-12-28 DIAGNOSIS — K219 Gastro-esophageal reflux disease without esophagitis: Secondary | ICD-10-CM

## 2013-12-28 NOTE — Progress Notes (Signed)
Pre-visit discussion using our clinic review tool. No additional management support is needed unless otherwise documented below in the visit note.  

## 2014-01-02 ENCOUNTER — Encounter: Payer: Self-pay | Admitting: Internal Medicine

## 2014-01-02 NOTE — Assessment & Plan Note (Signed)
Controlled.  Follow.   

## 2014-01-02 NOTE — Assessment & Plan Note (Signed)
On nexium.  Symptoms controlled.  No symptoms.  Will finish out her current prescription and then stop.  Will notify me if symptoms return.

## 2014-01-02 NOTE — Assessment & Plan Note (Signed)
Abdominal ultrasound negative.  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  Last bilirubin check wnl.  Follow.

## 2014-01-02 NOTE — Assessment & Plan Note (Signed)
Sees Dr Inez Pilgrim at [facility].  He felt the meningioma was stable.  Just evaluated.  Recommended f/u in two years.

## 2014-01-02 NOTE — Progress Notes (Signed)
  Subjective:    Patient ID: Carla Horne, female    DOB: Sep 10, 1956, 58 y.o.   MRN: [id number]  HPI 58 year old female with past history of meningioma who comes in today for a scheduled follow up.  States she has been doing well.  Retired.  Stays active.  Exercises.  No chest pain or tightness with increased activity or exertion.  No nausea or vomiting.  No bowel change.  Followed by Dr Jalene Mullet.   Everything has been stable.   States the current meningioma is stable.  She saw him after last visit and states everything checked out fine.  Recommended f/u in two years.  No chest congestion or cough.  Acid reflux controlled on nexium.  Doing well.  No symptoms.   No abdominal pain.  No nausea or vomiting.  No bowel change.  Overall feels good.     Past Medical History  Diagnosis Date  . Meningioma     Current Outpatient Prescriptions on File Prior to Visit  Medication Sig Dispense Refill  . esomeprazole (NEXIUM) 40 MG capsule Take 1 capsule (40 mg total) by mouth daily.  30 capsule  2  . fluticasone (FLONASE) 50 MCG/ACT nasal spray Place 2 sprays into both nostrils daily.  16 g  2   No current facility-administered medications on file prior to visit.    Review of Systems Patient denies any headache, lightheadedness or dizziness.  No sinus or allergy symptoms.   No chest pain, tightness or palpitations.  No increased shortness of breath, cough or congestion.  Acid reflux controlled on nexium.  No dysphagia or odynophagia.  No nausea or vomiting.  No abdominal pain or cramping.  No bowel change, such as diarrhea, constipation, BRBPR or melana.  No urine change.  Exercising.  Overall she feels she is doing relatively well.       Objective:   Physical Exam  Filed Vitals:   12/28/13 1435  BP: 120/80  Pulse: 60  Temp: 98.6 F (46 C)   58 year old female in no acute distress.   HEENT:  Nares- clear.  Oropharynx - without lesions. NECK:  Supple.  Nontender.  No audible bruit.  HEART:  Appears  to be regular. LUNGS:  No crackles or wheezing audible.  Respirations even and unlabored.  RADIAL PULSE:  Equal bilaterally.   ABDOMEN:  Soft, nontender.  Bowel sounds present and normal.  No audible abdominal bruit.     EXTREMITIES:  No increased edema present.  DP pulses palpable and equal bilaterally.           Assessment & Plan:  HEALTH MAINTENANCE.  Physical 10/26/13.  Pap 10/19/12 ok.  No need to repeat pap this year.  Mammogram 11/15/13 - Birads I.  States her last colonoscopy was approximately 2009.  Contacted GI.  Due f/u colonoscopy.

## 2014-03-03 ENCOUNTER — Encounter: Payer: Self-pay | Admitting: Internal Medicine

## 2014-03-03 ENCOUNTER — Telehealth: Payer: Self-pay | Admitting: Emergency Medicine

## 2014-03-10 LAB — HM COLONOSCOPY

## 2014-03-21 ENCOUNTER — Encounter: Payer: Self-pay | Admitting: Internal Medicine

## 2014-03-30 ENCOUNTER — Encounter: Payer: Self-pay | Admitting: Internal Medicine

## 2014-03-30 DIAGNOSIS — Z86010 Personal history of colonic polyps: Secondary | ICD-10-CM

## 2014-04-26 ENCOUNTER — Encounter: Payer: Self-pay | Admitting: Internal Medicine

## 2014-05-03 ENCOUNTER — Ambulatory Visit: Payer: BC Managed Care – PPO | Admitting: Internal Medicine

## 2014-06-17 IMAGING — MG MM DIGITAL SCREENING BILAT W/ CAD
1 series · 4 of 4 positions shown · non-contrast
Comparison: Previous exam(s).

CLINICAL DATA: Screening.

EXAM:
DIGITAL SCREENING BILATERAL MAMMOGRAM WITH CAD

[R CC · right · 4 of 4 slices shown]
[im 1/4]
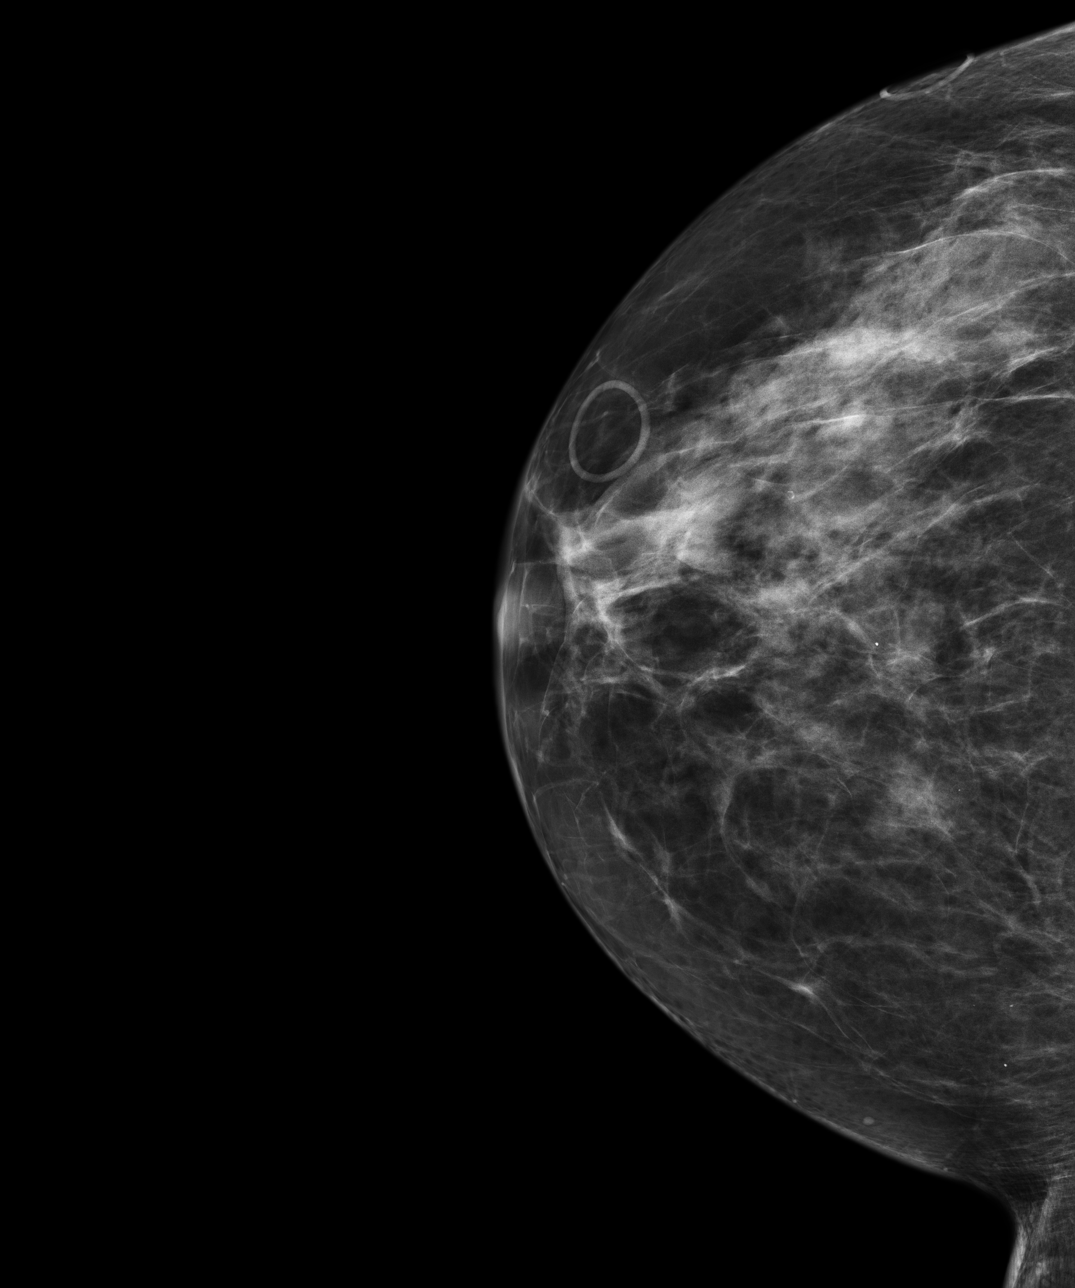
[im 2/4]
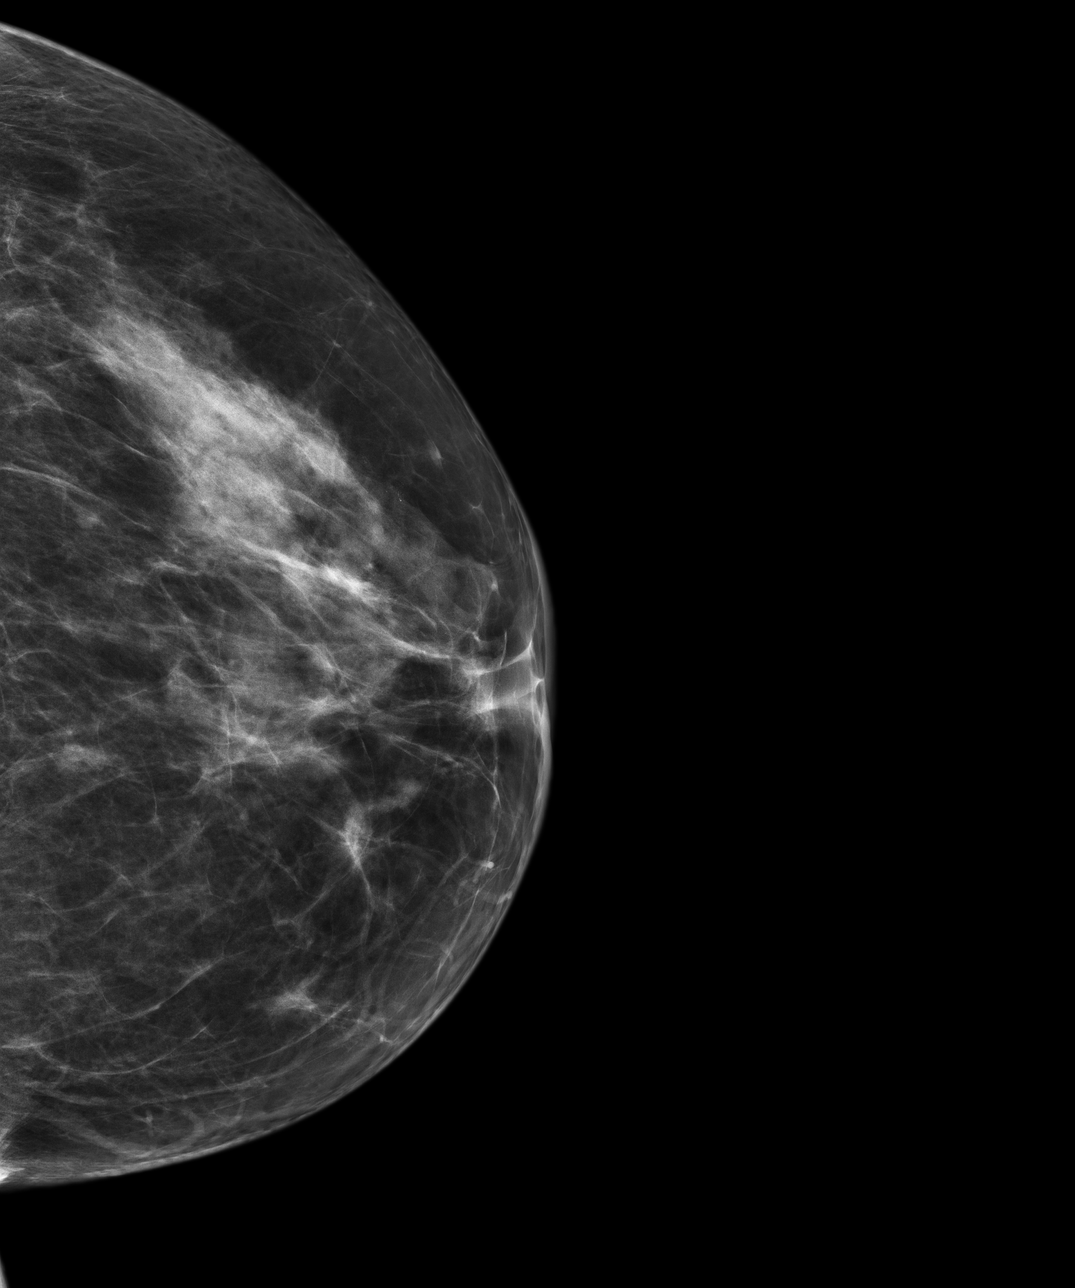
[im 3/4]
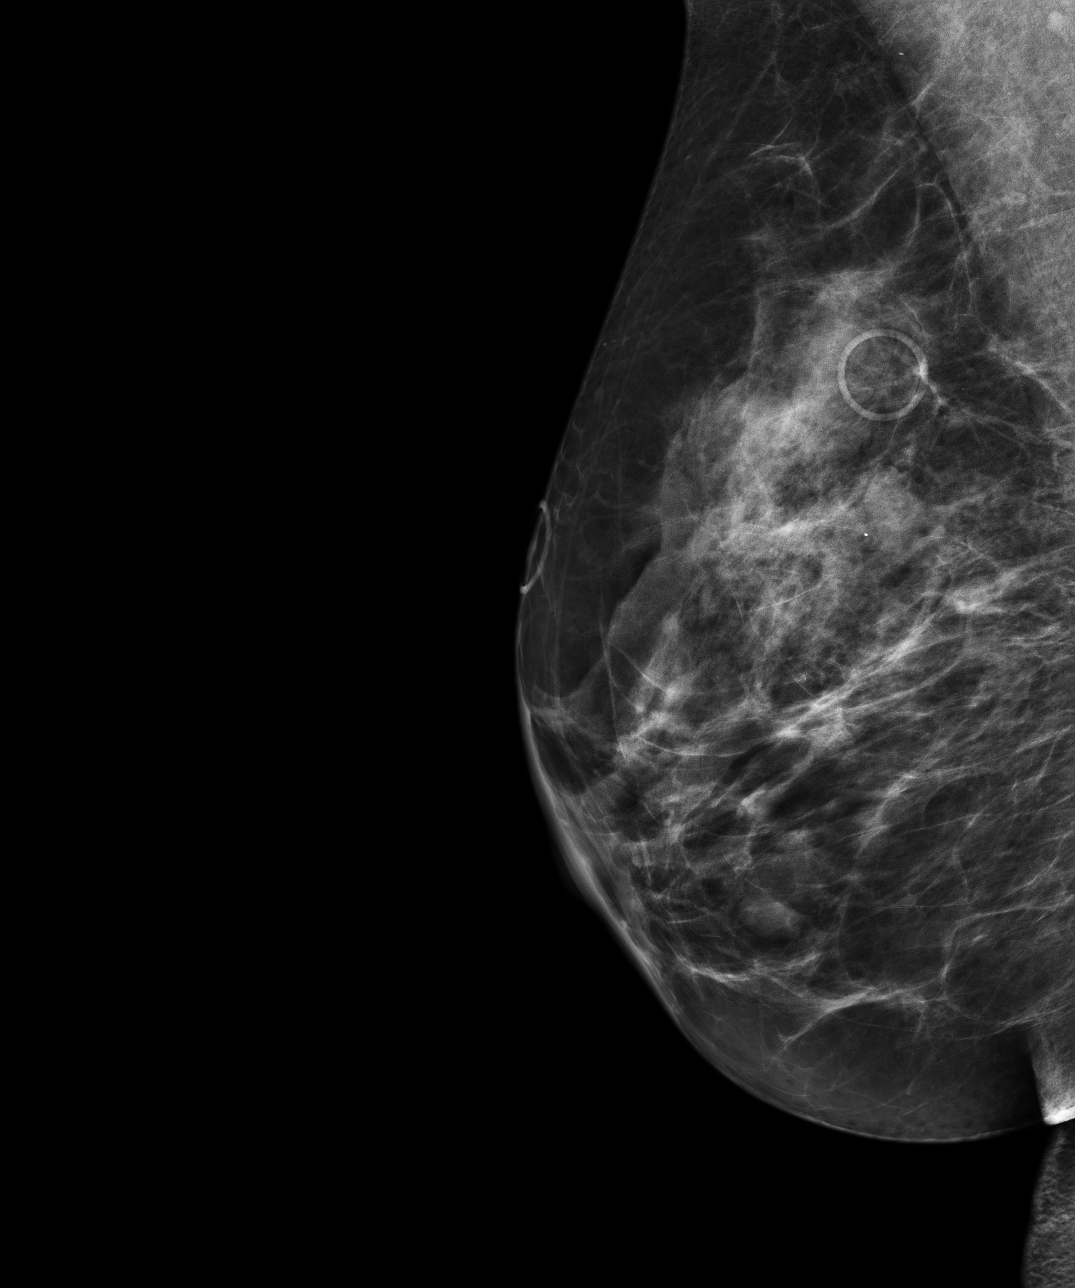
[im 4/4]
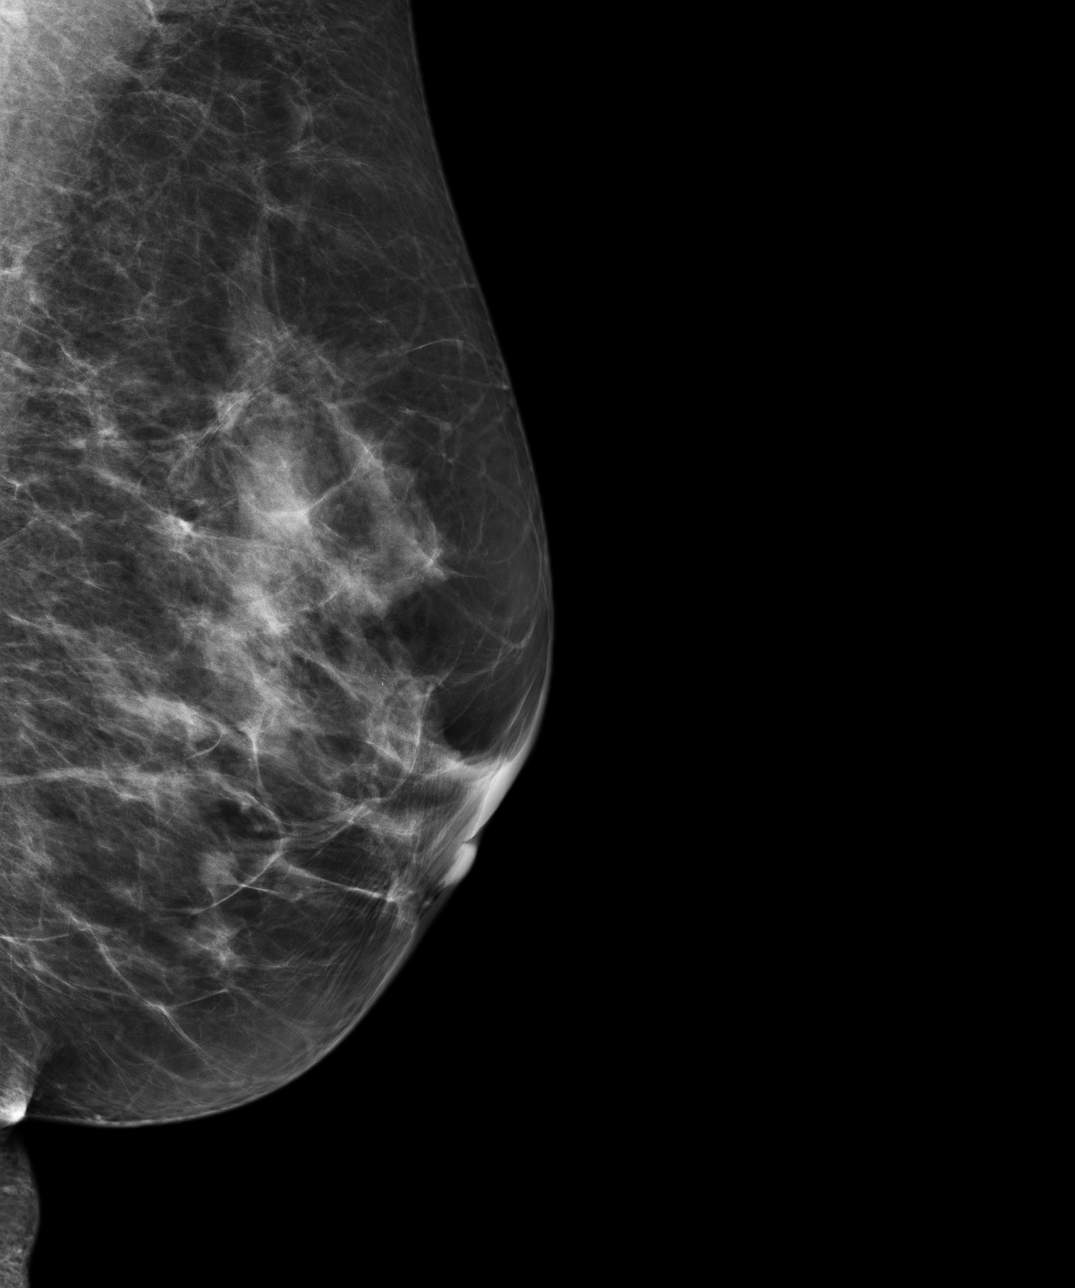

[4 of 4 positions shown; findings below may reference images not displayed]

ACR Breast Density Category c: The breasts are heterogeneously
dense, which may obscure small masses.
FINDINGS: There are no findings suspicious for malignancy. Images were
processed with CAD.
IMPRESSION: No mammographic evidence of malignancy. A result letter of this
screening mammogram will be mailed directly to the patient.

RECOMMENDATION:
Screening mammogram in one year. (Code:[id number])

BI-RADS CATEGORY  1: Negative

## 2014-06-28 ENCOUNTER — Ambulatory Visit: Payer: BC Managed Care – PPO | Admitting: Internal Medicine

## 2014-07-04 ENCOUNTER — Encounter: Payer: Self-pay | Admitting: Internal Medicine

## 2014-07-05 ENCOUNTER — Ambulatory Visit (INDEPENDENT_AMBULATORY_CARE_PROVIDER_SITE_OTHER): Payer: BC Managed Care – PPO | Admitting: Adult Health

## 2014-07-05 ENCOUNTER — Encounter: Payer: Self-pay | Admitting: Adult Health

## 2014-07-05 VITALS — BP 114/77 | HR 68 | Temp 98.2°F | Resp 14 | Wt 153.2 lb

## 2014-07-05 DIAGNOSIS — L237 Allergic contact dermatitis due to plants, except food: Secondary | ICD-10-CM

## 2014-07-05 DIAGNOSIS — B009 Herpesviral infection, unspecified: Secondary | ICD-10-CM

## 2014-07-05 DIAGNOSIS — L255 Unspecified contact dermatitis due to plants, except food: Secondary | ICD-10-CM

## 2014-07-05 DIAGNOSIS — B001 Herpesviral vesicular dermatitis: Secondary | ICD-10-CM

## 2014-07-05 MED ORDER — VALACYCLOVIR HCL 1 G PO TABS: ORAL_TABLET | ORAL | Status: DC

## 2014-07-05 MED ORDER — TRIAMCINOLONE ACETONIDE 0.1 % EX CREA: 1.0000 "application " | TOPICAL_CREAM | Freq: Two times a day (BID) | CUTANEOUS | Status: DC

## 2014-07-05 NOTE — Patient Instructions (Signed)
  Start triamcinolone cream to affected areas twice daily.  Avoid scratching or rubbing area.  Benadryl for itching. Continue calamine lotion as needed.  For fever blisters (herpes simplex) take 2000 mg twice daily for 1 day. I have provided you with 20 tablets which is good for 5 episodes.

## 2014-07-05 NOTE — Progress Notes (Signed)
Pre visit review using our clinic review tool, if applicable. No additional management support is needed unless otherwise documented below in the visit note. 

## 2014-07-05 NOTE — Telephone Encounter (Signed)
Scheduled appt with raquel today

## 2014-07-05 NOTE — Progress Notes (Signed)
   Subjective:    Patient ID: Carla Horne, female    DOB: [birth year], 58 y.o.   MRN: [id number]  Rash   58 yo female presents today for poison ivy. Indicates she first noticed it on 7/17 after carrying some brush outside. First noticed on her right leg which has now spread to her left arm. She has tried OTC remedies including calamine lotion, aloe, and apple vinegar. She has had poison ivy in the past and wants to stop it before it gets worse. Has taken prednisone in the past. Does not like the way it makes her feel.  Pt reports getting fever blister once yearly. She was previously prescribed valtrex by derm and would like prescription for same to have on hand.  Past Medical History  Diagnosis Date  . Meningioma     Current Outpatient Prescriptions on File Prior to Visit  Medication Sig Dispense Refill  . esomeprazole (NEXIUM) 40 MG capsule Take 1 capsule (40 mg total) by mouth daily.  30 capsule  2  . fluticasone (FLONASE) 50 MCG/ACT nasal spray Place 2 sprays into both nostrils daily.  16 g  2   No current facility-administered medications on file prior to visit.     Review of Systems  Constitutional: Negative.   Skin: Positive for rash (right leg and left arm).  All other systems reviewed and are negative.      Objective:  BP 114/77  Pulse 68  Temp(Src) 98.2 F (36.8 C) (Oral)  Resp 14  Wt 153 lb 4 oz (69.514 kg)  SpO2 98%   Physical Exam  Constitutional: She is oriented to person, place, and time. She appears well-developed and well-nourished. No distress.  Neurological: She is alert and oriented to person, place, and time.  Skin: Skin is warm and dry. Rash (small vesicles noted right leg and left arm. Some have ruptured.) noted.  Pt has been scratching areas and causing secondary lesions and spreading.  Psychiatric: She has a normal mood and affect. Her behavior is normal. Judgment and thought content normal.      Assessment & Plan:   1. Poison ivy Vesicles  present consistent with poison ivy. Start triamcinolone cream as described below. Instructed to wash hands to prevent spread. May take benedryl for itching and continue calamine lotion as needed. Avoid scratching or rubbing the areas. Follow up in clinic if symptoms worsen or fail to improve.   - triamcinolone cream (KENALOG) 0.1 %; Apply 1 application topically 2 (two) times daily.  Dispense: 30 g; Refill: 0  2. Fever blister Valtrex 2000 mg bid x 1 day at onset of herpes simplex virus. #20 tablets provided for 5 episodes. Refills 0

## 2014-07-07 ENCOUNTER — Encounter: Payer: Self-pay | Admitting: Adult Health

## 2014-07-08 ENCOUNTER — Other Ambulatory Visit: Payer: Self-pay | Admitting: Adult Health

## 2014-07-08 MED ORDER — PREDNISONE 10 MG PO TABS: ORAL_TABLET | ORAL | Status: DC

## 2014-08-17 ENCOUNTER — Encounter: Payer: Self-pay | Admitting: Adult Health

## 2014-08-17 NOTE — Telephone Encounter (Signed)
Informed patient that we understand her concerned.

## 2014-08-24 ENCOUNTER — Encounter: Payer: Self-pay | Admitting: Internal Medicine

## 2014-08-24 DIAGNOSIS — M25562 Pain in left knee: Secondary | ICD-10-CM | POA: Insufficient documentation

## 2014-09-01 ENCOUNTER — Ambulatory Visit: Payer: BC Managed Care – PPO | Admitting: Internal Medicine

## 2014-11-01 ENCOUNTER — Other Ambulatory Visit: Payer: Self-pay | Admitting: Internal Medicine

## 2014-11-01 ENCOUNTER — Ambulatory Visit (INDEPENDENT_AMBULATORY_CARE_PROVIDER_SITE_OTHER): Payer: BC Managed Care – PPO | Admitting: Internal Medicine

## 2014-11-01 ENCOUNTER — Encounter: Payer: Self-pay | Admitting: Internal Medicine

## 2014-11-01 VITALS — BP 117/72 | HR 66 | Temp 98.1°F | Ht 65.0 in | Wt 157.2 lb

## 2014-11-01 DIAGNOSIS — Z1239 Encounter for other screening for malignant neoplasm of breast: Secondary | ICD-10-CM

## 2014-11-01 DIAGNOSIS — C439 Malignant melanoma of skin, unspecified: Secondary | ICD-10-CM

## 2014-11-01 DIAGNOSIS — D329 Benign neoplasm of meninges, unspecified: Secondary | ICD-10-CM

## 2014-11-01 DIAGNOSIS — R351 Nocturia: Secondary | ICD-10-CM

## 2014-11-01 DIAGNOSIS — E7800 Pure hypercholesterolemia, unspecified: Secondary | ICD-10-CM

## 2014-11-01 DIAGNOSIS — Z860100 Personal history of colon polyps, unspecified: Secondary | ICD-10-CM

## 2014-11-01 DIAGNOSIS — M25562 Pain in left knee: Secondary | ICD-10-CM

## 2014-11-01 DIAGNOSIS — K219 Gastro-esophageal reflux disease without esophagitis: Secondary | ICD-10-CM

## 2014-11-01 LAB — CBC WITH DIFFERENTIAL/PLATELET
BASOS ABS: 0 10*3/uL (ref 0.0–0.1)
Basophils Relative: 0.6 % (ref 0.0–3.0)
EOS ABS: 0.1 10*3/uL (ref 0.0–0.7)
Eosinophils Relative: 2.3 % (ref 0.0–5.0)
HCT: 43.5 % (ref 36.0–46.0)
Hemoglobin: 14.4 g/dL (ref 12.0–15.0)
LYMPHS PCT: 30.9 % (ref 12.0–46.0)
Lymphs Abs: 1.9 10*3/uL (ref 0.7–4.0)
MCHC: 33.1 g/dL (ref 30.0–36.0)
MCV: 94 fl (ref 78.0–100.0)
Monocytes Absolute: 0.5 10*3/uL (ref 0.1–1.0)
Monocytes Relative: 8.7 % (ref 3.0–12.0)
NEUTROS PCT: 57.5 % (ref 43.0–77.0)
Neutro Abs: 3.5 10*3/uL (ref 1.4–7.7)
Platelets: 226 10*3/uL (ref 150.0–400.0)
RBC: 4.63 Mil/uL (ref 3.87–5.11)
RDW: 13.5 % (ref 11.5–15.5)
WBC: 6.1 10*3/uL (ref 4.0–10.5)

## 2014-11-01 LAB — LIPID PANEL
Cholesterol: 225 mg/dL — ABNORMAL HIGH (ref 0–200)
HDL: 75.1 mg/dL (ref >39.00)
LDL Cholesterol: 137 mg/dL — ABNORMAL HIGH (ref 0–99)
NONHDL: 149.9
Total CHOL/HDL Ratio: 3
Triglycerides: 63 mg/dL (ref 0.0–149.0)
VLDL: 12.6 mg/dL (ref 0.0–40.0)

## 2014-11-01 LAB — HEPATIC FUNCTION PANEL
ALK PHOS: 71 U/L (ref 39–117)
ALT: 18 U/L (ref 0–35)
AST: 24 U/L (ref 0–37)
Albumin: 4.5 g/dL (ref 3.5–5.2)
BILIRUBIN TOTAL: 1.3 mg/dL — AB (ref 0.2–1.2)
Bilirubin, Direct: 0.2 mg/dL (ref 0.0–0.3)
Total Protein: 7.2 g/dL (ref 6.0–8.3)

## 2014-11-01 LAB — BASIC METABOLIC PANEL
BUN: 12 mg/dL (ref 6–23)
CALCIUM: 9.5 mg/dL (ref 8.4–10.5)
CHLORIDE: 109 meq/L (ref 96–112)
CO2: 25 meq/L (ref 19–32)
CREATININE: 0.9 mg/dL (ref 0.4–1.2)
GFR: 72.18 mL/min (ref >60.00)
GLUCOSE: 85 mg/dL (ref 70–99)
Potassium: 4.3 mEq/L (ref 3.5–5.1)
Sodium: 142 mEq/L (ref 135–145)

## 2014-11-01 LAB — TSH: TSH: 0.98 u[IU]/mL (ref 0.35–4.50)

## 2014-11-01 NOTE — Progress Notes (Signed)
Pre visit review using our clinic review tool, if applicable. No additional management support is needed unless otherwise documented below in the visit note. 

## 2014-11-01 NOTE — Progress Notes (Signed)
Order placed for urinalysis 

## 2014-11-02 ENCOUNTER — Encounter: Payer: Self-pay | Admitting: Internal Medicine

## 2014-11-02 ENCOUNTER — Other Ambulatory Visit: Payer: Self-pay | Admitting: Internal Medicine

## 2014-11-02 ENCOUNTER — Other Ambulatory Visit: Payer: Self-pay | Admitting: *Deleted

## 2014-11-02 DIAGNOSIS — N3000 Acute cystitis without hematuria: Secondary | ICD-10-CM

## 2014-11-02 DIAGNOSIS — B3749 Other urogenital candidiasis: Secondary | ICD-10-CM

## 2014-11-02 LAB — URINALYSIS, ROUTINE W REFLEX MICROSCOPIC
Bilirubin Urine: NEGATIVE
HGB URINE DIPSTICK: NEGATIVE
Ketones, ur: NEGATIVE
Nitrite: NEGATIVE
RBC / HPF: NONE SEEN (ref 0–?)
Specific Gravity, Urine: 1.01 (ref 1.000–1.030)
TOTAL PROTEIN, URINE-UPE24: NEGATIVE
URINE GLUCOSE: NEGATIVE
Urobilinogen, UA: 0.2 (ref 0.0–1.0)
pH: 7.5 (ref 5.0–8.0)

## 2014-11-02 NOTE — Progress Notes (Signed)
Order placed for urine culture 

## 2014-11-05 LAB — CULTURE, URINE COMPREHENSIVE

## 2014-11-06 ENCOUNTER — Encounter: Payer: Self-pay | Admitting: Internal Medicine

## 2014-11-06 DIAGNOSIS — R351 Nocturia: Secondary | ICD-10-CM | POA: Insufficient documentation

## 2014-11-06 NOTE — Progress Notes (Signed)
Subjective:    Patient ID: Carla Horne, female    DOB: 1956/11/07, 58 y.o.   MRN: [id number]  HPI 58 year old female with past history of meningioma who comes in today for her physical exam and to f/u on a few issues.  States she has been doing well.  Retired.  Stays active.  Exercises.  No chest pain or tightness with increased activity or exertion.  No nausea or vomiting.  No bowel change.  Followed by Dr Jalene Mullet.   Everything has been stable.   States the current meningioma is stable.  She saw him after last physical and states everything checked out fine.  Recommended f/u in two years.  No chest congestion or cough.  Acid reflux controlled on nexium.  Doing well.  No symptoms.   No abdominal pain.  No nausea or vomiting.  No bowel change.  Overall feels good.  Just had colonoscopy.  Polyps removed.  Has been having problems with her left knee.  Has a torn meniscus.  Seeing surgery [facility]).  Also reports some nocturia (3x/night).  No dysuria.  No hematuria.    Past Medical History  Diagnosis Date  . Meningioma     Current Outpatient Prescriptions on File Prior to Visit  Medication Sig Dispense Refill  . fluticasone (FLONASE) 50 MCG/ACT nasal spray Place 2 sprays into both nostrils daily. 16 g 2   No current facility-administered medications on file prior to visit.    Review of Systems Patient denies any headache, lightheadedness or dizziness.  No sinus or allergy symptoms.   No chest pain, tightness or palpitations.  No increased shortness of breath, cough or congestion.  Acid reflux controlled on nexium.  No dysphagia or odynophagia.  No nausea or vomiting.  No abdominal pain or cramping.  No bowel change, such as diarrhea, constipation, BRBPR or melana.  Nocturia.  Exercising.  Overall she feels she is doing relatively well.  Left knee pain as outlined.        Objective:   Physical Exam  Filed Vitals:   11/01/14 1338  BP: 117/72  Pulse: 66  Temp: 98.1 F (22.34 C)    58 year old female in no acute distress.   HEENT:  Nares- clear.  Oropharynx - without lesions. NECK:  Supple.  Nontender.  No audible bruit.  HEART:  Appears to be regular. LUNGS:  No crackles or wheezing audible.  Respirations even and unlabored.  RADIAL PULSE:  Equal bilaterally.    BREASTS:  No nipple discharge or nipple retraction present.  Could not appreciate any distinct nodules or axillary adenopathy.  ABDOMEN:  Soft, nontender.  Bowel sounds present and normal.  No audible abdominal bruit.  GU:  Not performed.    EXTREMITIES:  No increased edema present.  DP pulses palpable and equal bilaterally.           Assessment & Plan:  1. Gastroesophageal reflux disease, esophagitis presence not specified On omeprazole.  Symptoms controlled.    2. Meningioma Followed by Dr Inez Pilgrim at [facility].  Felt meningioma stable.  Recommended f/u 2 years from last visit.    3. Hyperbilirubinemia - Hepatic function panel  4. History of colonic polyps Colonoscopy 03/10/14 - three polyps removed from transverse colon.  Recommended f/u colonoscopy in 3 years.    5. Knee pain, left Has a torn meniscus.  Seeing surgery.    6. Melanoma of skin F/u with dermatology.   - CBC with Differential - Hepatic  function panel - Basic metabolic panel  7. Hypercholesterolemia Low cholesterol diet and exercise.  - Lipid panel  8. Breast cancer screening - MM DIGITAL SCREENING BILATERAL; Future  9. Nocturia - Urinalysis, Routine w reflex microscopic - to confirm no infection.   HEALTH MAINTENANCE.  Physical today.  Pap 10/19/12 ok.  No need to repeat pap this year.  Mammogram 11/15/13 - Birads I.  Schedule f/u mammogram.  Colonoscopy 03/10/14 - three polyps transverse colon and internal hemorrhoids.  Recommended f/u colonoscopy in 3 years.

## 2014-11-07 ENCOUNTER — Encounter: Payer: Self-pay | Admitting: *Deleted

## 2014-11-07 ENCOUNTER — Other Ambulatory Visit: Payer: Self-pay | Admitting: *Deleted

## 2014-11-07 DIAGNOSIS — R109 Unspecified abdominal pain: Secondary | ICD-10-CM

## 2014-11-07 MED ORDER — CIPROFLOXACIN HCL 500 MG PO TABS: 500.0000 mg | ORAL_TABLET | Freq: Two times a day (BID) | ORAL | Status: DC

## 2014-11-22 ENCOUNTER — Other Ambulatory Visit (INDEPENDENT_AMBULATORY_CARE_PROVIDER_SITE_OTHER): Payer: BC Managed Care – PPO | Admitting: *Deleted

## 2014-11-22 DIAGNOSIS — R109 Unspecified abdominal pain: Secondary | ICD-10-CM

## 2014-11-22 DIAGNOSIS — N3000 Acute cystitis without hematuria: Secondary | ICD-10-CM

## 2014-11-22 DIAGNOSIS — R100 Acute abdomen: Secondary | ICD-10-CM

## 2014-11-22 NOTE — Telephone Encounter (Signed)
Order placed for urinalysis 

## 2014-11-23 LAB — URINALYSIS, MICROSCOPIC ONLY
BACTERIA UA: NONE SEEN
Casts: NONE SEEN
Crystals: NONE SEEN
Squamous Epithelial / LPF: NONE SEEN

## 2014-11-23 LAB — URINALYSIS, ROUTINE W REFLEX MICROSCOPIC
BILIRUBIN URINE: NEGATIVE
GLUCOSE, UA: NEGATIVE mg/dL
Hgb urine dipstick: NEGATIVE
KETONES UR: NEGATIVE mg/dL
Nitrite: NEGATIVE
PROTEIN: NEGATIVE mg/dL
Specific Gravity, Urine: 1.02 (ref 1.005–1.030)
UROBILINOGEN UA: 0.2 mg/dL (ref 0.0–1.0)
pH: 6 (ref 5.0–8.0)

## 2014-11-24 ENCOUNTER — Encounter: Payer: Self-pay | Admitting: Internal Medicine

## 2014-11-24 LAB — CULTURE, URINE COMPREHENSIVE
Colony Count: NO GROWTH
Organism ID, Bacteria: NO GROWTH

## 2014-11-25 NOTE — Telephone Encounter (Signed)
Pt notified & doing fine

## 2014-12-02 ENCOUNTER — Encounter: Payer: Self-pay | Admitting: *Deleted

## 2014-12-02 ENCOUNTER — Ambulatory Visit: Payer: Self-pay | Admitting: Internal Medicine

## 2014-12-02 LAB — HM MAMMOGRAPHY: HM Mammogram: NEGATIVE

## 2015-01-10 ENCOUNTER — Encounter: Payer: Self-pay | Admitting: Internal Medicine

## 2015-05-23 ENCOUNTER — Ambulatory Visit: Payer: BC Managed Care – PPO | Admitting: Internal Medicine

## 2015-07-14 ENCOUNTER — Ambulatory Visit: Payer: BC Managed Care – PPO | Admitting: Internal Medicine

## 2015-08-09 ENCOUNTER — Ambulatory Visit: Payer: BC Managed Care – PPO | Admitting: Internal Medicine

## 2015-09-14 ENCOUNTER — Encounter: Payer: Self-pay | Admitting: Internal Medicine

## 2015-09-14 ENCOUNTER — Ambulatory Visit (INDEPENDENT_AMBULATORY_CARE_PROVIDER_SITE_OTHER): Payer: BC Managed Care – PPO | Admitting: Internal Medicine

## 2015-09-14 VITALS — BP 112/80 | HR 63 | Temp 98.2°F | Resp 18 | Ht 65.0 in | Wt 149.5 lb

## 2015-09-14 DIAGNOSIS — Z86010 Personal history of colonic polyps: Secondary | ICD-10-CM

## 2015-09-14 DIAGNOSIS — D329 Benign neoplasm of meninges, unspecified: Secondary | ICD-10-CM

## 2015-09-14 DIAGNOSIS — M25562 Pain in left knee: Secondary | ICD-10-CM

## 2015-09-14 DIAGNOSIS — Z1239 Encounter for other screening for malignant neoplasm of breast: Secondary | ICD-10-CM | POA: Diagnosis not present

## 2015-09-14 DIAGNOSIS — K219 Gastro-esophageal reflux disease without esophagitis: Secondary | ICD-10-CM

## 2015-09-14 DIAGNOSIS — C439 Malignant melanoma of skin, unspecified: Secondary | ICD-10-CM

## 2015-09-14 DIAGNOSIS — E7800 Pure hypercholesterolemia, unspecified: Secondary | ICD-10-CM

## 2015-09-14 LAB — HEPATIC FUNCTION PANEL
ALT: 15 U/L (ref 0–35)
AST: 20 U/L (ref 0–37)
Albumin: 4.1 g/dL (ref 3.5–5.2)
Alkaline Phosphatase: 76 U/L (ref 39–117)
BILIRUBIN TOTAL: 0.5 mg/dL (ref 0.2–1.2)
Bilirubin, Direct: 0.1 mg/dL (ref 0.0–0.3)
Total Protein: 6.8 g/dL (ref 6.0–8.3)

## 2015-09-14 LAB — BASIC METABOLIC PANEL
BUN: 20 mg/dL (ref 6–23)
CO2: 31 mEq/L (ref 19–32)
Calcium: 9.4 mg/dL (ref 8.4–10.5)
Chloride: 105 mEq/L (ref 96–112)
Creatinine, Ser: 0.87 mg/dL (ref 0.40–1.20)
GFR: 71.01 mL/min (ref >60.00)
Glucose, Bld: 95 mg/dL (ref 70–99)
POTASSIUM: 5.1 meq/L (ref 3.5–5.1)
SODIUM: 141 meq/L (ref 135–145)

## 2015-09-14 LAB — CBC WITH DIFFERENTIAL/PLATELET
BASOS PCT: 0.4 % (ref 0.0–3.0)
Basophils Absolute: 0 10*3/uL (ref 0.0–0.1)
Eosinophils Absolute: 0.1 10*3/uL (ref 0.0–0.7)
Eosinophils Relative: 3.3 % (ref 0.0–5.0)
HEMATOCRIT: 43.4 % (ref 36.0–46.0)
Hemoglobin: 14.3 g/dL (ref 12.0–15.0)
LYMPHS PCT: 30.1 % (ref 12.0–46.0)
Lymphs Abs: 1.3 10*3/uL (ref 0.7–4.0)
MCHC: 33 g/dL (ref 30.0–36.0)
MCV: 94.8 fl (ref 78.0–100.0)
MONOS PCT: 11.2 % (ref 3.0–12.0)
Monocytes Absolute: 0.5 10*3/uL (ref 0.1–1.0)
NEUTROS ABS: 2.4 10*3/uL (ref 1.4–7.7)
Neutrophils Relative %: 55 % (ref 43.0–77.0)
PLATELETS: 207 10*3/uL (ref 150.0–400.0)
RBC: 4.57 Mil/uL (ref 3.87–5.11)
RDW: 13.7 % (ref 11.5–15.5)
WBC: 4.3 10*3/uL (ref 4.0–10.5)

## 2015-09-14 LAB — LIPID PANEL
CHOL/HDL RATIO: 3
Cholesterol: 202 mg/dL — ABNORMAL HIGH (ref 0–200)
HDL: 62.7 mg/dL (ref >39.00)
LDL CALC: 127 mg/dL — AB (ref 0–99)
NonHDL: 138.91
Triglycerides: 61 mg/dL (ref 0.0–149.0)
VLDL: 12.2 mg/dL (ref 0.0–40.0)

## 2015-09-14 LAB — TSH: TSH: 1.48 u[IU]/mL (ref 0.35–4.50)

## 2015-09-14 NOTE — Progress Notes (Signed)
Patient ID: Carla Horne, female   DOB: 10/31/1956, 59 y.o.   MRN: [id number]   Subjective:    Patient ID: Carla Horne, female    DOB: 19-Jun-1956, 59 y.o.   MRN: [id number]  HPI  Patient with past history of GERD and meningioma who comes in today for a scheduled follow up.  She is followed by Dr Tommi Rumps at [facility] for meningioma.  When last evaluated, felt stable and recommended f/u in two years.  Is doing well.  No headache or dizziness reported.  Stays active.  No cardiac symptoms with increased activity or exertion.  No sob.  Off PPI.  No acid reflux if she monitors her diet.  Has adjusted her diet.  Bowels stable.  Some left knee pain.  S/p surgery.  Some pain has returned.  Right great toenail fungus.     Past Medical History  Diagnosis Date  . Meningioma    Past Surgical History  Procedure Laterality Date  . Bladder mesh implant..1990    . Breast surgery  1990    breast biopsy  . Remval of mengionoma  2010   Family History  Problem Relation Age of Onset  . Hypercholesterolemia Father   . Hypertension Father   . Breast cancer Paternal Grandmother   . Colon cancer Neg Hx    Social History   Social History  . Marital Status: Married    Spouse Name: N/A  . Number of Children: 2  . Years of Education: N/A   Occupational History  . retired Pharmacist, hospital    Social History Main Topics  . Smoking status: Never Smoker   . Smokeless tobacco: Never Used  . Alcohol Use: No  . Drug Use: No  . Sexual Activity: Not Asked   Other Topics Concern  . None   Social History Narrative    Outpatient Encounter Prescriptions as of 09/14/2015  Medication Sig  . fluticasone (FLONASE) 50 MCG/ACT nasal spray Place 2 sprays into both nostrils daily. (Patient taking differently: Place 2 sprays into both nostrils as needed. )  . [DISCONTINUED] ciprofloxacin (CIPRO) 500 MG tablet Take 1 tablet (500 mg total) by mouth 2 (two) times daily.  . [DISCONTINUED] Omeprazole Magnesium 20.6 (20 BASE) MG CPDR  Take by mouth daily.   No facility-administered encounter medications on file as of 09/14/2015.    Review of Systems  Constitutional: Negative for appetite change and unexpected weight change.  HENT: Negative for congestion and sinus pressure.   Respiratory: Negative for cough, chest tightness and shortness of breath.   Cardiovascular: Negative for chest pain, palpitations and leg swelling.  Gastrointestinal: Negative for nausea, vomiting, abdominal pain and diarrhea.  Musculoskeletal: Negative for back pain and joint swelling.       S/p left knee surgery.  Some knee pain now.  Desires no further intervention.  Tylenol prn.   Skin: Negative for color change and rash.       Toenail fungus - right great toe.  Has taken medication previously.  Has returned.  Request refill.   Neurological: Negative for dizziness, light-headedness and headaches.  Psychiatric/Behavioral: Negative for dysphoric mood and agitation.       Objective:    Physical Exam  Constitutional: She appears well-developed and well-nourished. No distress.  HENT:  Nose: Nose normal.  Mouth/Throat: Oropharynx is clear and moist.  Eyes: Conjunctivae are normal. Right eye exhibits no discharge. Left eye exhibits no discharge.  Neck: Neck supple. No thyromegaly present.  Cardiovascular: Normal rate and regular  rhythm.   Pulmonary/Chest: Breath sounds normal. No respiratory distress. She has no wheezes.  Abdominal: Soft. Bowel sounds are normal. There is no tenderness.  Musculoskeletal: She exhibits no edema or tenderness.  No significant pain to palpation.  No increased soft tissue swelling.  No significant pain with resistance against flexion of knee.    Lymphadenopathy:    She has no cervical adenopathy.  Skin: No rash noted. No erythema.  Right great toenail fungus.    Psychiatric: She has a normal mood and affect. Her behavior is normal.    BP 112/80 mmHg  Pulse 63  Temp(Src) 98.2 F (36.8 C) (Oral)  Resp 18   Ht 5\' 5"  (1.651 m)  Wt 149 lb 8 oz (67.813 kg)  BMI 24.88 kg/m2  SpO2 97% Wt Readings from Last 3 Encounters:  09/14/15 149 lb 8 oz (67.813 kg)  11/01/14 157 lb 4 oz (71.328 kg)  07/05/14 153 lb 4 oz (69.514 kg)     Lab Results  Component Value Date   WBC 4.3 09/14/2015   HGB 14.3 09/14/2015   HCT 43.4 09/14/2015   PLT 207.0 09/14/2015   GLUCOSE 95 09/14/2015   CHOL 202* 09/14/2015   TRIG 61.0 09/14/2015   HDL 62.70 09/14/2015   LDLDIRECT 131.9 10/26/2013   LDLCALC 127* 09/14/2015   ALT 15 09/14/2015   AST 20 09/14/2015   NA 141 09/14/2015   K 5.1 09/14/2015   CL 105 09/14/2015   CREATININE 0.87 09/14/2015   BUN 20 09/14/2015   CO2 31 09/14/2015   TSH 1.48 09/14/2015       Assessment & Plan:   Problem List Items Addressed This Visit    GERD (gastroesophageal reflux disease)    No acid reflux symptoms if watches her diet.  Off PPI.       History of colonic polyps    Colonoscopy 03/10/14 - outlined in overview.  Recommended f/u colonoscopy in 02/2017.        Hyperbilirubinemia    Abdominal ultrasound negative.  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  Recheck liver panel today.        Hypercholesterolemia    Low cholesterol diet and exercise.  Check lipid panel with labs today.        Relevant Orders   TSH (Completed)   Lipid panel (Completed)   Knee pain, left    S/p left knee arthroscopy with partial medial meniscectomy and chondroplasty 06/23/14 - Dr Therese Sarah.  Some minimal knee pain.  Stretches.  Desires no further intervention at this time.  Follow.        Melanoma of skin    Sees dermatology.  She plans to call for f/u appt.        Relevant Orders   Hepatic function panel (Completed)   CBC with Differential/Platelet (Completed)   Basic metabolic panel (Completed)   Meningioma    Sees Dr Inez Pilgrim at [facility]  He felt meningioma stable.  Recommended f/u in two years after last visit.         Other Visit Diagnoses    Breast cancer screening    -  Primary     Relevant Orders    MM DIGITAL SCREENING BILATERAL        Einar Pheasant, MD

## 2015-09-14 NOTE — Progress Notes (Signed)
Pre-visit discussion using our clinic review tool. No additional management support is needed unless otherwise documented below in the visit note.  

## 2015-09-15 ENCOUNTER — Encounter: Payer: Self-pay | Admitting: Internal Medicine

## 2015-09-15 DIAGNOSIS — B351 Tinea unguium: Secondary | ICD-10-CM

## 2015-09-15 DIAGNOSIS — E7800 Pure hypercholesterolemia, unspecified: Secondary | ICD-10-CM | POA: Insufficient documentation

## 2015-09-15 NOTE — Assessment & Plan Note (Signed)
Sees Dr Inez Pilgrim at [facility].  He felt meningioma stable.  Recommended f/u in two years after last visit.

## 2015-09-15 NOTE — Assessment & Plan Note (Signed)
Sees dermatology.  She plans to call for f/u appt.

## 2015-09-15 NOTE — Assessment & Plan Note (Signed)
S/p left knee arthroscopy with partial medial meniscectomy and chondroplasty 06/23/14 - Dr Therese Sarah.  Some minimal knee pain.  Stretches.  Desires no further intervention at this time.  Follow.

## 2015-09-15 NOTE — Assessment & Plan Note (Signed)
Abdominal ultrasound negative.  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  Recheck liver panel today.

## 2015-09-15 NOTE — Assessment & Plan Note (Signed)
Low cholesterol diet and exercise.  Check lipid panel with labs today.

## 2015-09-15 NOTE — Assessment & Plan Note (Signed)
Colonoscopy 03/10/14 - outlined in overview.  Recommended f/u colonoscopy in 02/2017.

## 2015-09-15 NOTE — Assessment & Plan Note (Signed)
No acid reflux symptoms if watches her diet.  Off PPI.

## 2015-09-17 ENCOUNTER — Other Ambulatory Visit: Payer: Self-pay | Admitting: Internal Medicine

## 2015-09-17 DIAGNOSIS — E875 Hyperkalemia: Secondary | ICD-10-CM

## 2015-09-17 NOTE — Progress Notes (Signed)
Order placed for f/u potassium check.  

## 2015-09-19 MED ORDER — TERBINAFINE HCL 250 MG PO TABS: 250.0000 mg | ORAL_TABLET | Freq: Every day | ORAL | Status: DC

## 2015-09-19 NOTE — Telephone Encounter (Signed)
Unread mychart message mailed to patient 

## 2015-09-19 NOTE — Telephone Encounter (Signed)
rx sent in for lamisil #30 with one refill.  Pt notified via my chart.  Needs liver panel checked.

## 2015-09-19 NOTE — Addendum Note (Signed)
Addended by: Alisa Graff on: 09/19/2015 10:54 AM   Modules accepted: Orders

## 2015-09-20 NOTE — Telephone Encounter (Signed)
Unread mychart message mailed to patient 

## 2015-10-02 ENCOUNTER — Other Ambulatory Visit: Payer: BC Managed Care – PPO

## 2015-10-25 ENCOUNTER — Other Ambulatory Visit (INDEPENDENT_AMBULATORY_CARE_PROVIDER_SITE_OTHER): Payer: BC Managed Care – PPO

## 2015-10-25 DIAGNOSIS — B351 Tinea unguium: Secondary | ICD-10-CM | POA: Diagnosis not present

## 2015-10-25 DIAGNOSIS — E875 Hyperkalemia: Secondary | ICD-10-CM | POA: Diagnosis not present

## 2015-10-25 LAB — HEPATIC FUNCTION PANEL
ALK PHOS: 68 U/L (ref 39–117)
ALT: 16 U/L (ref 0–35)
AST: 21 U/L (ref 0–37)
Albumin: 4.2 g/dL (ref 3.5–5.2)
BILIRUBIN DIRECT: 0.2 mg/dL (ref 0.0–0.3)
BILIRUBIN TOTAL: 0.8 mg/dL (ref 0.2–1.2)
Total Protein: 6.9 g/dL (ref 6.0–8.3)

## 2015-10-25 LAB — POTASSIUM: POTASSIUM: 4.5 meq/L (ref 3.5–5.1)

## 2015-10-26 ENCOUNTER — Other Ambulatory Visit: Payer: BC Managed Care – PPO

## 2015-10-26 ENCOUNTER — Encounter: Payer: Self-pay | Admitting: Internal Medicine

## 2015-10-30 NOTE — Telephone Encounter (Signed)
Unread mychart message mailed to patient 

## 2015-11-20 ENCOUNTER — Encounter: Payer: Self-pay | Admitting: Internal Medicine

## 2015-11-21 ENCOUNTER — Other Ambulatory Visit: Payer: Self-pay | Admitting: *Deleted

## 2015-11-21 ENCOUNTER — Other Ambulatory Visit: Payer: Self-pay | Admitting: Internal Medicine

## 2015-11-21 DIAGNOSIS — Z79899 Other long term (current) drug therapy: Secondary | ICD-10-CM

## 2015-11-21 MED ORDER — TERBINAFINE HCL 250 MG PO TABS: 250.0000 mg | ORAL_TABLET | Freq: Every day | ORAL | Status: DC

## 2015-11-21 NOTE — Telephone Encounter (Signed)
Please call pt and schedule a lab appointment.  She does need her liver function tests checked.  See her my chart message.  She would like to come in Wednesday am.  I will place the order for the lab.  If lab ok, I will send in rx for another month of medication.

## 2015-11-21 NOTE — Progress Notes (Signed)
Order placed for f/u liver panel.  

## 2015-12-05 ENCOUNTER — Ambulatory Visit
Admission: RE | Admit: 2015-12-05 | Discharge: 2015-12-05 | Disposition: A | Discharge status: Home / Self Care | Payer: BC Managed Care – PPO | Source: Ambulatory Visit | Attending: Internal Medicine | Admitting: Internal Medicine

## 2015-12-05 DIAGNOSIS — Z1239 Encounter for other screening for malignant neoplasm of breast: Secondary | ICD-10-CM

## 2015-12-13 ENCOUNTER — Ambulatory Visit
Admission: RE | Admit: 2015-12-13 | Discharge: 2015-12-13 | Disposition: A | Discharge status: Home / Self Care | Payer: BC Managed Care – PPO | Source: Ambulatory Visit | Attending: Internal Medicine | Admitting: Internal Medicine

## 2015-12-13 DIAGNOSIS — Z1231 Encounter for screening mammogram for malignant neoplasm of breast: Secondary | ICD-10-CM | POA: Diagnosis not present

## 2015-12-14 ENCOUNTER — Encounter: Payer: Self-pay | Admitting: Internal Medicine

## 2015-12-14 DIAGNOSIS — Z0000 Encounter for general adult medical examination without abnormal findings: Secondary | ICD-10-CM | POA: Insufficient documentation

## 2015-12-19 ENCOUNTER — Other Ambulatory Visit (INDEPENDENT_AMBULATORY_CARE_PROVIDER_SITE_OTHER): Payer: BC Managed Care – PPO

## 2015-12-19 DIAGNOSIS — Z79899 Other long term (current) drug therapy: Secondary | ICD-10-CM

## 2015-12-19 LAB — HEPATIC FUNCTION PANEL
ALK PHOS: 76 U/L (ref 39–117)
ALT: 20 U/L (ref 0–35)
AST: 22 U/L (ref 0–37)
Albumin: 4.4 g/dL (ref 3.5–5.2)
BILIRUBIN DIRECT: 0.1 mg/dL (ref 0.0–0.3)
TOTAL PROTEIN: 7.3 g/dL (ref 6.0–8.3)
Total Bilirubin: 0.7 mg/dL (ref 0.2–1.2)

## 2015-12-20 ENCOUNTER — Encounter: Payer: Self-pay | Admitting: Internal Medicine

## 2015-12-22 NOTE — Telephone Encounter (Signed)
Unread mychart message mailed to patient 

## 2016-01-18 ENCOUNTER — Encounter: Payer: BC Managed Care – PPO | Admitting: Internal Medicine

## 2016-03-19 ENCOUNTER — Other Ambulatory Visit (HOSPITAL_COMMUNITY)
Admission: RE | Admit: 2016-03-19 | Discharge: 2016-03-19 | Disposition: A | Discharge status: Home / Self Care | Payer: BC Managed Care – PPO | Source: Ambulatory Visit | Attending: Internal Medicine | Admitting: Internal Medicine

## 2016-03-19 ENCOUNTER — Encounter: Payer: Self-pay | Admitting: Internal Medicine

## 2016-03-19 ENCOUNTER — Ambulatory Visit (INDEPENDENT_AMBULATORY_CARE_PROVIDER_SITE_OTHER): Payer: BC Managed Care – PPO | Admitting: Internal Medicine

## 2016-03-19 VITALS — BP 130/70 | HR 65 | Temp 98.8°F | Resp 18 | Ht 64.25 in | Wt 153.0 lb

## 2016-03-19 DIAGNOSIS — Z01419 Encounter for gynecological examination (general) (routine) without abnormal findings: Secondary | ICD-10-CM | POA: Diagnosis present

## 2016-03-19 DIAGNOSIS — Z124 Encounter for screening for malignant neoplasm of cervix: Secondary | ICD-10-CM

## 2016-03-19 DIAGNOSIS — M25562 Pain in left knee: Secondary | ICD-10-CM

## 2016-03-19 DIAGNOSIS — Z1151 Encounter for screening for human papillomavirus (HPV): Secondary | ICD-10-CM | POA: Diagnosis present

## 2016-03-19 DIAGNOSIS — Z0000 Encounter for general adult medical examination without abnormal findings: Secondary | ICD-10-CM

## 2016-03-19 DIAGNOSIS — D329 Benign neoplasm of meninges, unspecified: Secondary | ICD-10-CM

## 2016-03-19 DIAGNOSIS — C439 Malignant melanoma of skin, unspecified: Secondary | ICD-10-CM

## 2016-03-19 DIAGNOSIS — E7800 Pure hypercholesterolemia, unspecified: Secondary | ICD-10-CM

## 2016-03-19 DIAGNOSIS — Z86010 Personal history of colonic polyps: Secondary | ICD-10-CM

## 2016-03-19 NOTE — Progress Notes (Signed)
Pre-visit discussion using our clinic review tool. No additional management support is needed unless otherwise documented below in the visit note.  

## 2016-03-19 NOTE — Progress Notes (Signed)
Patient ID: Carla Horne, female   DOB: Oct 18, 1956, 60 y.o.   MRN: [id number]   Subjective:    Patient ID: Carla Horne, female    DOB: 02-19-1956, 60 y.o.   MRN: [id number]  HPI  Patient here for her physical.  She feels she is doing well.  Saw ortho.  Has torn meniscus in left knee.  Stable.  Is exercising.  Going to the [facility].  Desires no further intervention.  No chest pain or tightness.  No sob.  No acid reflux.  No abdominal pain or cramping.  Bowels stable.     Past Medical History  Diagnosis Date  . Meningioma [facility])    Past Surgical History  Procedure Laterality Date  . Bladder mesh implant..1990    . Breast surgery  1990    breast biopsy  . Remval of mengionoma  2010  . Breast biopsy  20+ yrs ago    core - neg   Family History  Problem Relation Age of Onset  . Hypercholesterolemia Father   . Hypertension Father   . Breast cancer Paternal Grandmother   . Colon cancer Neg Hx    Social History   Social History  . Marital Status: Married    Spouse Name: N/A  . Number of Children: 2  . Years of Education: N/A   Occupational History  . retired Pharmacist, hospital    Social History Main Topics  . Smoking status: Never Smoker   . Smokeless tobacco: Never Used  . Alcohol Use: No  . Drug Use: No  . Sexual Activity: Not Asked   Other Topics Concern  . None   Social History Narrative    Outpatient Encounter Prescriptions as of 03/19/2016  Medication Sig  . fluticasone (FLONASE) 50 MCG/ACT nasal spray Place 2 sprays into both nostrils daily. (Patient taking differently: Place 2 sprays into both nostrils as needed. )  . meloxicam (MOBIC) 15 MG tablet   . [DISCONTINUED] terbinafine (LAMISIL) 250 MG tablet Take 1 tablet (250 mg total) by mouth daily.   No facility-administered encounter medications on file as of 03/19/2016.    Review of Systems  Constitutional: Negative for appetite change and unexpected weight change.  HENT: Negative for congestion and sinus pressure.   Eyes:  Negative for pain and visual disturbance.  Respiratory: Negative for cough, chest tightness and shortness of breath.   Cardiovascular: Negative for chest pain, palpitations and leg swelling.  Gastrointestinal: Negative for nausea, vomiting, abdominal pain and diarrhea.  Genitourinary: Negative for dysuria and difficulty urinating.  Musculoskeletal: Negative for back pain and joint swelling.       Has had left knee issues and pain as outlined.   Skin: Negative for color change and rash.  Neurological: Negative for dizziness, light-headedness and headaches.  Hematological: Negative for adenopathy. Does not bruise/bleed easily.  Psychiatric/Behavioral: Negative for dysphoric mood and agitation.       Objective:    Physical Exam  Constitutional: She is oriented to person, place, and time. She appears well-developed and well-nourished. No distress.  HENT:  Nose: Nose normal.  Mouth/Throat: Oropharynx is clear and moist.  Eyes: Right eye exhibits no discharge. Left eye exhibits no discharge. No scleral icterus.  Neck: Neck supple. No thyromegaly present.  Cardiovascular: Normal rate and regular rhythm.   Pulmonary/Chest: Breath sounds normal. No accessory muscle usage. No tachypnea. No respiratory distress. She has no decreased breath sounds. She has no wheezes. She has no rhonchi. Right breast exhibits no inverted nipple, no  mass, no nipple discharge and no tenderness (no axillary adenopathy). Left breast exhibits no inverted nipple, no mass, no nipple discharge and no tenderness (no axilarry adenopathy).  Abdominal: Soft. Bowel sounds are normal. There is no tenderness.  Genitourinary:  Normal external genitalia.  Vaginal vault without lesions.  Cervix identified.  Pap smear performed.  Could not appreciate any adnexal masses or tenderness.    Musculoskeletal: She exhibits no edema or tenderness.  Lymphadenopathy:    She has no cervical adenopathy.  Neurological: She is alert and oriented  to person, place, and time.  Skin: Skin is warm. No rash noted. No erythema.  Psychiatric: She has a normal mood and affect. Her behavior is normal.    BP 130/70 mmHg  Pulse 65  Temp(Src) 98.8 F (37.1 C) (Oral)  Resp 18  Ht 5' 4.25" (1.632 m)  Wt 153 lb (69.4 kg)  BMI 26.06 kg/m2  SpO2 98% Wt Readings from Last 3 Encounters:  03/19/16 153 lb (69.4 kg)  09/14/15 149 lb 8 oz (67.813 kg)  11/01/14 157 lb 4 oz (71.328 kg)     Lab Results  Component Value Date   WBC 4.4 03/19/2016   HGB 14.3 03/19/2016   HCT 42.9 03/19/2016   PLT 203.0 03/19/2016   GLUCOSE 83 03/19/2016   CHOL 202* 03/19/2016   TRIG 51.0 03/19/2016   HDL 76.00 03/19/2016   LDLDIRECT 131.9 10/26/2013   LDLCALC 116* 03/19/2016   ALT 14 03/19/2016   AST 20 03/19/2016   NA 137 03/19/2016   K 4.4 03/19/2016   CL 102 03/19/2016   CREATININE 0.85 03/19/2016   BUN 16 03/19/2016   CO2 30 03/19/2016   TSH 1.48 09/14/2015    Mm Digital Screening Bilateral  12/13/2015  CLINICAL DATA:  Screening. EXAM: DIGITAL SCREENING BILATERAL MAMMOGRAM WITH CAD COMPARISON:  Previous exam(s). ACR Breast Density Category c: The breast tissue is heterogeneously dense, which may obscure small masses. FINDINGS: There are no findings suspicious for malignancy. Images were processed with CAD. IMPRESSION: No mammographic evidence of malignancy. A result letter of this screening mammogram will be mailed directly to the patient. RECOMMENDATION: Screening mammogram in one year. (Code:SM-B-01Y) BI-RADS CATEGORY  1: Negative. Electronically Signed   By: Claudie Revering M.D.   On: 12/13/2015 15:31       Assessment & Plan:   Problem List Items Addressed This Visit    Health care maintenance    Physical today 03/19/16.  Colonoscopy 03/10/14.  Recommended f/u colonoscopy in 02/2017.  Mammogram 12/13/15 - Birads I.  PAP 03/19/16.       History of colonic polyps    Colonoscopy 03/10/14 - outlined in overview.  Recommended f/u colonoscopy in 02/2017.          Hyperbilirubinemia    Had abdominal ultrasound negative.  Recheck liver panel today.        Relevant Orders   Hepatic function panel (Completed)   Basic metabolic panel (Completed)   Hypercholesterolemia    Low cholesterol diet and exercise.  Follow lipid panel.  Check today.       Relevant Orders   CBC with Differential/Platelet (Completed)   Lipid panel (Completed)   Knee pain, left    Saw orthoh.  S/p left knee arthroscopy.  Is exercising.  Desires no further intervention.  Stable.       Melanoma of skin ([place])    Followed by dermatology.       Relevant Medications   meloxicam (MOBIC) 15 MG tablet  Meningioma ([place])    Followed by Dr Inez Pilgrim at [facility].  Felt stable.  Had recommended two year f/u after last visit.         Other Visit Diagnoses    Pap smear for cervical cancer screening    -  Primary    Relevant Orders    Cytology - PAP        Einar Pheasant, MD

## 2016-03-20 ENCOUNTER — Encounter: Payer: Self-pay | Admitting: Internal Medicine

## 2016-03-20 LAB — BASIC METABOLIC PANEL
BUN: 16 mg/dL (ref 6–23)
CHLORIDE: 102 meq/L (ref 96–112)
CO2: 30 meq/L (ref 19–32)
CREATININE: 0.85 mg/dL (ref 0.40–1.20)
Calcium: 9.7 mg/dL (ref 8.4–10.5)
GFR: 72.81 mL/min (ref >60.00)
GLUCOSE: 83 mg/dL (ref 70–99)
Potassium: 4.4 mEq/L (ref 3.5–5.1)
Sodium: 137 mEq/L (ref 135–145)

## 2016-03-20 LAB — LIPID PANEL
CHOL/HDL RATIO: 3
Cholesterol: 202 mg/dL — ABNORMAL HIGH (ref 0–200)
HDL: 76 mg/dL (ref >39.00)
LDL Cholesterol: 116 mg/dL — ABNORMAL HIGH (ref 0–99)
NONHDL: 126.37
Triglycerides: 51 mg/dL (ref 0.0–149.0)
VLDL: 10.2 mg/dL (ref 0.0–40.0)

## 2016-03-20 LAB — CBC WITH DIFFERENTIAL/PLATELET
BASOS ABS: 0 10*3/uL (ref 0.0–0.1)
Basophils Relative: 0.9 % (ref 0.0–3.0)
EOS ABS: 0.1 10*3/uL (ref 0.0–0.7)
EOS PCT: 2.4 % (ref 0.0–5.0)
HCT: 42.9 % (ref 36.0–46.0)
Hemoglobin: 14.3 g/dL (ref 12.0–15.0)
LYMPHS ABS: 1.6 10*3/uL (ref 0.7–4.0)
Lymphocytes Relative: 37.2 % (ref 12.0–46.0)
MCHC: 33.3 g/dL (ref 30.0–36.0)
MCV: 94.7 fl (ref 78.0–100.0)
MONO ABS: 0.4 10*3/uL (ref 0.1–1.0)
Monocytes Relative: 9.4 % (ref 3.0–12.0)
NEUTROS PCT: 50.1 % (ref 43.0–77.0)
Neutro Abs: 2.2 10*3/uL (ref 1.4–7.7)
PLATELETS: 203 10*3/uL (ref 150.0–400.0)
RBC: 4.53 Mil/uL (ref 3.87–5.11)
RDW: 13.7 % (ref 11.5–15.5)
WBC: 4.4 10*3/uL (ref 4.0–10.5)

## 2016-03-20 LAB — HEPATIC FUNCTION PANEL
ALK PHOS: 62 U/L (ref 39–117)
ALT: 14 U/L (ref 0–35)
AST: 20 U/L (ref 0–37)
Albumin: 4.4 g/dL (ref 3.5–5.2)
BILIRUBIN DIRECT: 0.2 mg/dL (ref 0.0–0.3)
BILIRUBIN TOTAL: 1.3 mg/dL — AB (ref 0.2–1.2)
Total Protein: 7.1 g/dL (ref 6.0–8.3)

## 2016-03-21 ENCOUNTER — Encounter: Payer: Self-pay | Admitting: Internal Medicine

## 2016-03-21 NOTE — Assessment & Plan Note (Signed)
Followed by dermatology

## 2016-03-21 NOTE — Assessment & Plan Note (Signed)
Followed by Dr Inez Pilgrim at [facility].  Felt stable.  Had recommended two year f/u after last visit.

## 2016-03-21 NOTE — Assessment & Plan Note (Signed)
Had abdominal ultrasound negative.  Recheck liver panel today.

## 2016-03-21 NOTE — Assessment & Plan Note (Signed)
Saw orthoh.  S/p left knee arthroscopy.  Is exercising.  Desires no further intervention.  Stable.

## 2016-03-21 NOTE — Assessment & Plan Note (Signed)
Low cholesterol diet and exercise. Follow lipid panel.  Check today.  ?

## 2016-03-21 NOTE — Assessment & Plan Note (Signed)
Colonoscopy 03/10/14 - outlined in overview.  Recommended f/u colonoscopy in 02/2017.   

## 2016-03-21 NOTE — Assessment & Plan Note (Signed)
Physical today 03/19/16.  Colonoscopy 03/10/14.  Recommended f/u colonoscopy in 02/2017.  Mammogram 12/13/15 - Birads I.  PAP 03/19/16.

## 2016-03-22 ENCOUNTER — Encounter: Payer: Self-pay | Admitting: Internal Medicine

## 2016-03-22 LAB — CYTOLOGY - PAP

## 2016-03-22 NOTE — Telephone Encounter (Signed)
Unread mychart message mailed to patient 

## 2016-04-18 ENCOUNTER — Telehealth: Payer: Self-pay | Admitting: *Deleted

## 2016-04-18 ENCOUNTER — Other Ambulatory Visit (INDEPENDENT_AMBULATORY_CARE_PROVIDER_SITE_OTHER): Payer: BC Managed Care – PPO

## 2016-04-18 LAB — HEPATIC FUNCTION PANEL
ALBUMIN: 4.5 g/dL (ref 3.5–5.2)
ALK PHOS: 78 U/L (ref 39–117)
ALT: 19 U/L (ref 0–35)
AST: 22 U/L (ref 0–37)
Bilirubin, Direct: 0.1 mg/dL (ref 0.0–0.3)
TOTAL PROTEIN: 7.1 g/dL (ref 6.0–8.3)
Total Bilirubin: 0.8 mg/dL (ref 0.2–1.2)

## 2016-04-18 NOTE — Telephone Encounter (Signed)
Labs and dx?  

## 2016-04-18 NOTE — Telephone Encounter (Signed)
Order placed for liver panel.  

## 2016-04-19 ENCOUNTER — Encounter: Payer: Self-pay | Admitting: Internal Medicine

## 2016-04-25 NOTE — Telephone Encounter (Signed)
Unread mychart message mailed to patient 

## 2016-06-19 ENCOUNTER — Encounter: Payer: Self-pay | Admitting: Internal Medicine

## 2016-07-17 ENCOUNTER — Encounter: Payer: Self-pay | Admitting: Internal Medicine

## 2016-07-17 ENCOUNTER — Ambulatory Visit (INDEPENDENT_AMBULATORY_CARE_PROVIDER_SITE_OTHER): Payer: BC Managed Care – PPO | Admitting: Internal Medicine

## 2016-07-17 VITALS — BP 112/60 | HR 66 | Temp 98.7°F | Resp 18 | Ht 64.25 in | Wt 154.2 lb

## 2016-07-17 DIAGNOSIS — R0781 Pleurodynia: Secondary | ICD-10-CM | POA: Diagnosis not present

## 2016-07-17 DIAGNOSIS — D329 Benign neoplasm of meninges, unspecified: Secondary | ICD-10-CM | POA: Diagnosis not present

## 2016-07-17 DIAGNOSIS — M5489 Other dorsalgia: Secondary | ICD-10-CM | POA: Diagnosis not present

## 2016-07-17 DIAGNOSIS — E2839 Other primary ovarian failure: Secondary | ICD-10-CM | POA: Diagnosis not present

## 2016-07-17 NOTE — Progress Notes (Signed)
Pre-visit discussion using our clinic review tool. No additional management support is needed unless otherwise documented below in the visit note.  

## 2016-07-17 NOTE — Progress Notes (Signed)
Patient ID: Carla Horne, female   DOB: 09/21/1956, 60 y.o.   MRN: [id number]   Subjective:    Patient ID: Carla Horne, female    DOB: 30-Aug-1956, 60 y.o.   MRN: [id number]  HPI  Patient here as a work in to discuss some persistent ongoing back pain and to discuss obtaining a bone density.  She went to acute care recently.  Increased mid back pain.  See note.  Treated.  Still with pain.  States this pain has been present over the last year.  Worsens at times.  Always notices.  No pain with deep breathing.  Pain localized to right lateral mid back.  No sob.  She had xray at acute care.  Told changes concerning for osteoporosis.  Discussed bone density.  Stays active.  Exercises.     Past Medical History:  Diagnosis Date  . Meningioma [facility])    Past Surgical History:  Procedure Laterality Date  . Bladder Mesh Implant..1990    . BREAST BIOPSY  20+ yrs ago   core - neg  . BREAST SURGERY  1990   breast biopsy  . Remval of mengionoma  2010   Family History  Problem Relation Age of Onset  . Hypercholesterolemia Father   . Hypertension Father   . Breast cancer Paternal Grandmother   . Colon cancer Neg Hx    Social History   Social History  . Marital status: Married    Spouse name: N/A  . Number of children: 2  . Years of education: N/A   Occupational History  . retired Pharmacist, hospital    Social History Main Topics  . Smoking status: Never Smoker  . Smokeless tobacco: Never Used  . Alcohol use No  . Drug use: No  . Sexual activity: Not Asked   Other Topics Concern  . None   Social History Narrative  . None    Outpatient Encounter Prescriptions as of 07/17/2016  Medication Sig  . meloxicam (MOBIC) 15 MG tablet   . [DISCONTINUED] fluticasone (FLONASE) 50 MCG/ACT nasal spray Place 2 sprays into both nostrils daily. (Patient taking differently: Place 2 sprays into both nostrils as needed. )   No facility-administered encounter medications on file as of 07/17/2016.     Review of  Systems  Constitutional: Negative for appetite change and unexpected weight change.  Respiratory: Negative for cough, chest tightness and shortness of breath.   Cardiovascular: Negative for chest pain, palpitations and leg swelling.  Gastrointestinal: Negative for abdominal pain, diarrhea, nausea and vomiting.  Genitourinary: Negative for difficulty urinating and dysuria.  Musculoskeletal: Positive for back pain. Negative for joint swelling and myalgias.  Skin: Negative for color change and rash.  Neurological: Negative for dizziness, light-headedness and headaches.  Psychiatric/Behavioral: Negative for agitation and dysphoric mood.       Objective:    Physical Exam  Constitutional: She appears well-developed and well-nourished. No distress.  HENT:  Nose: Nose normal.  Mouth/Throat: Oropharynx is clear and moist.  Neck: Neck supple. No thyromegaly present.  Cardiovascular: Normal rate and regular rhythm.   Pulmonary/Chest: Breath sounds normal. No respiratory distress. She has no wheezes.  Abdominal: Soft. Bowel sounds are normal. There is no tenderness.  Musculoskeletal: She exhibits no edema or tenderness.  Pain to palpation - right lateral back - over posterior ribcage.    Lymphadenopathy:    She has no cervical adenopathy.  Skin: No rash noted. No erythema.  Psychiatric: She has a normal mood and affect. Her behavior  is normal.    BP 112/60 (BP Location: Right Arm, Patient Position: Sitting, Cuff Size: Normal)   Pulse 66   Temp 98.7 F (37.1 C) (Oral)   Resp 18   Ht 5' 4.25" (1.632 m)   Wt 154 lb 4 oz (70 kg)   SpO2 96%   BMI 26.27 kg/m  Wt Readings from Last 3 Encounters:  07/17/16 154 lb 4 oz (70 kg)  03/19/16 153 lb (69.4 kg)  09/14/15 149 lb 8 oz (67.8 kg)     Lab Results  Component Value Date   WBC 4.4 03/19/2016   HGB 14.3 03/19/2016   HCT 42.9 03/19/2016   PLT 203.0 03/19/2016   GLUCOSE 83 03/19/2016   CHOL 202 (H) 03/19/2016   TRIG 51.0 03/19/2016     HDL 76.00 03/19/2016   LDLDIRECT 131.9 10/26/2013   LDLCALC 116 (H) 03/19/2016   ALT 19 04/18/2016   AST 22 04/18/2016   NA 137 03/19/2016   K 4.4 03/19/2016   CL 102 03/19/2016   CREATININE 0.85 03/19/2016   BUN 16 03/19/2016   CO2 30 03/19/2016   TSH 1.48 09/14/2015       Assessment & Plan:   Problem List Items Addressed This Visit    Back pain    Pain to palpation over the right mid lateral back - over the posterior ribs.  Had thoracic spine xray.  Persistent pain.  Present now over the last year.  Notices all of the time.  Discussed further w/up.  She is concerned regarding the persistence.  Stays active.  Exercises.  Will check bone scan.        Estrogen deficiency - Primary    Recent xray raised concerns about osteoporosis.  Discussed with her today.  Discussed continuing weight bearing exercise.  Discussed vitamin D supplementation.  Obtain bone density.        Relevant Orders   DG Bone Density   Meningioma ([place])    Followed by Dr Inez Pilgrim at [facility].   Felt stable.        Other Visit Diagnoses    Rib pain on right side       Relevant Orders   NM Bone Marrow Scan Limited     I spent 25 minutes with the patient and more than 50% of the time was spent in consultation regarding the above.     Einar Pheasant, MD

## 2016-07-18 ENCOUNTER — Encounter: Payer: Self-pay | Admitting: Internal Medicine

## 2016-07-18 DIAGNOSIS — M549 Dorsalgia, unspecified: Secondary | ICD-10-CM | POA: Insufficient documentation

## 2016-07-18 DIAGNOSIS — E2839 Other primary ovarian failure: Secondary | ICD-10-CM | POA: Insufficient documentation

## 2016-07-18 NOTE — Assessment & Plan Note (Signed)
Followed by Dr Inez Pilgrim at [facility].   Felt stable.

## 2016-07-18 NOTE — Assessment & Plan Note (Signed)
Pain to palpation over the right mid lateral back - over the posterior ribs.  Had thoracic spine xray.  Persistent pain.  Present now over the last year.  Notices all of the time.  Discussed further w/up.  She is concerned regarding the persistence.  Stays active.  Exercises.  Will check bone scan.

## 2016-07-18 NOTE — Assessment & Plan Note (Signed)
Recent xray raised concerns about osteoporosis.  Discussed with her today.  Discussed continuing weight bearing exercise.  Discussed vitamin D supplementation.  Obtain bone density.

## 2016-07-19 ENCOUNTER — Other Ambulatory Visit: Payer: Self-pay | Admitting: Internal Medicine

## 2016-07-19 DIAGNOSIS — M5489 Other dorsalgia: Secondary | ICD-10-CM

## 2016-07-19 DIAGNOSIS — R0781 Pleurodynia: Secondary | ICD-10-CM

## 2016-07-26 ENCOUNTER — Other Ambulatory Visit: Payer: BC Managed Care – PPO

## 2016-08-12 ENCOUNTER — Encounter: Payer: Self-pay | Admitting: Internal Medicine

## 2016-08-20 ENCOUNTER — Other Ambulatory Visit: Payer: BC Managed Care – PPO

## 2016-08-31 ENCOUNTER — Encounter: Payer: Self-pay | Admitting: Internal Medicine

## 2016-09-04 ENCOUNTER — Encounter: Admission: RE | Admit: 2016-09-04 | Payer: BC Managed Care – PPO | Source: Ambulatory Visit

## 2016-09-09 ENCOUNTER — Ambulatory Visit: Payer: BC Managed Care – PPO | Admitting: Internal Medicine

## 2016-09-17 ENCOUNTER — Ambulatory Visit: Payer: BC Managed Care – PPO

## 2016-10-02 ENCOUNTER — Encounter: Payer: Self-pay | Admitting: Internal Medicine

## 2016-10-02 ENCOUNTER — Ambulatory Visit
Admission: RE | Admit: 2016-10-02 | Discharge: 2016-10-02 | Disposition: A | Discharge status: Home / Self Care | Payer: BC Managed Care – PPO | Source: Ambulatory Visit | Attending: Internal Medicine | Admitting: Internal Medicine

## 2016-10-02 DIAGNOSIS — Z780 Asymptomatic menopausal state: Secondary | ICD-10-CM | POA: Diagnosis not present

## 2016-10-02 DIAGNOSIS — M8588 Other specified disorders of bone density and structure, other site: Secondary | ICD-10-CM | POA: Diagnosis not present

## 2016-10-02 DIAGNOSIS — M85852 Other specified disorders of bone density and structure, left thigh: Secondary | ICD-10-CM | POA: Insufficient documentation

## 2016-10-02 DIAGNOSIS — E2839 Other primary ovarian failure: Secondary | ICD-10-CM | POA: Diagnosis present

## 2016-10-07 ENCOUNTER — Encounter: Payer: Self-pay | Admitting: Radiology

## 2016-10-09 ENCOUNTER — Encounter: Admission: RE | Admit: 2016-10-09 | Payer: BC Managed Care – PPO | Source: Ambulatory Visit

## 2016-10-09 ENCOUNTER — Ambulatory Visit
Admission: RE | Admit: 2016-10-09 | Discharge: 2016-10-09 | Disposition: A | Discharge status: Home / Self Care | Payer: BC Managed Care – PPO | Source: Ambulatory Visit | Attending: Internal Medicine | Admitting: Internal Medicine

## 2016-10-09 DIAGNOSIS — R0789 Other chest pain: Secondary | ICD-10-CM

## 2016-10-09 DIAGNOSIS — M5489 Other dorsalgia: Secondary | ICD-10-CM

## 2016-10-09 DIAGNOSIS — R0781 Pleurodynia: Secondary | ICD-10-CM | POA: Diagnosis present

## 2016-10-09 MED ORDER — TECHNETIUM TC 99M MEDRONATE IV KIT
23.5600 | PACK | Freq: Once | INTRAVENOUS | Status: AC | PRN
  Administered 2016-10-09: 23.56 via INTRAVENOUS

## 2016-10-10 ENCOUNTER — Encounter: Payer: Self-pay | Admitting: Internal Medicine

## 2016-10-14 NOTE — Progress Notes (Signed)
Lm for pt to call and schedule appt.

## 2016-10-14 NOTE — Telephone Encounter (Signed)
Lm on vm for pt to call back and schedule appt

## 2016-12-03 ENCOUNTER — Ambulatory Visit (INDEPENDENT_AMBULATORY_CARE_PROVIDER_SITE_OTHER): Payer: BC Managed Care – PPO | Admitting: Internal Medicine

## 2016-12-03 ENCOUNTER — Encounter: Payer: Self-pay | Admitting: Internal Medicine

## 2016-12-03 VITALS — BP 128/72 | HR 65 | Temp 98.1°F | Ht 64.0 in | Wt 153.6 lb

## 2016-12-03 DIAGNOSIS — M8580 Other specified disorders of bone density and structure, unspecified site: Secondary | ICD-10-CM

## 2016-12-03 LAB — BASIC METABOLIC PANEL
BUN: 16 mg/dL (ref 6–23)
CALCIUM: 9.4 mg/dL (ref 8.4–10.5)
CO2: 28 mEq/L (ref 19–32)
CREATININE: 0.76 mg/dL (ref 0.40–1.20)
Chloride: 104 mEq/L (ref 96–112)
GFR: 82.65 mL/min (ref >60.00)
Glucose, Bld: 88 mg/dL (ref 70–99)
Potassium: 4.9 mEq/L (ref 3.5–5.1)
SODIUM: 139 meq/L (ref 135–145)

## 2016-12-03 LAB — URINALYSIS, ROUTINE W REFLEX MICROSCOPIC
BILIRUBIN URINE: NEGATIVE
HGB URINE DIPSTICK: NEGATIVE
KETONES UR: NEGATIVE
LEUKOCYTES UA: NEGATIVE
NITRITE: NEGATIVE
RBC / HPF: NONE SEEN (ref 0–?)
Specific Gravity, Urine: 1.015 (ref 1.000–1.030)
TOTAL PROTEIN, URINE-UPE24: NEGATIVE
URINE GLUCOSE: NEGATIVE
UROBILINOGEN UA: 0.2 (ref 0.0–1.0)
WBC, UA: NONE SEEN (ref 0–?)
pH: 6 (ref 5.0–8.0)

## 2016-12-03 LAB — VITAMIN D 25 HYDROXY (VIT D DEFICIENCY, FRACTURES): VITD: 42.36 ng/mL (ref 30.00–100.00)

## 2016-12-03 NOTE — Progress Notes (Signed)
Patient ID: Carla Horne, female   DOB: 22-Oct-1956, 60 y.o.   MRN: [id number]   Subjective:    Patient ID: Carla Horne, female    DOB: 1956/05/06, 60 y.o.   MRN: [id number]  HPI  Patient here for a scheduled follow up.  She is here to discuss her bone density.  Bone density revealed osteopenia - t score -2.3.   Discussed treatment options.  Discussed risk and side effects of treatment options.  No swallowing problems.  No acid reflux.  No abdominal pain or cramping.  Bowels stable.     Past Medical History:  Diagnosis Date  . Meningioma [facility])    Past Surgical History:  Procedure Laterality Date  . Bladder Mesh Implant..1990    . BREAST BIOPSY  20+ yrs ago   core - neg  . BREAST SURGERY  1990   breast biopsy  . Remval of mengionoma  2010   Family History  Problem Relation Age of Onset  . Hypercholesterolemia Father   . Hypertension Father   . Breast cancer Paternal Grandmother   . Colon cancer Neg Hx    Social History   Social History  . Marital status: Married    Spouse name: N/A  . Number of children: 2  . Years of education: N/A   Occupational History  . retired Pharmacist, hospital    Social History Main Topics  . Smoking status: Never Smoker  . Smokeless tobacco: Never Used  . Alcohol use No  . Drug use: No  . Sexual activity: Not Asked     Comment: LMP 2010   Other Topics Concern  . None   Social History Narrative  . None    Outpatient Encounter Prescriptions as of 12/03/2016  Medication Sig  . meloxicam (MOBIC) 15 MG tablet    No facility-administered encounter medications on file as of 12/03/2016.     Review of Systems  Constitutional: Negative for appetite change and unexpected weight change.  Respiratory: Negative for cough and shortness of breath.   Cardiovascular: Negative for chest pain and leg swelling.  Gastrointestinal: Negative for nausea and vomiting.  Musculoskeletal:       OA - knee.         Objective:    Physical Exam  Constitutional:  She appears well-developed and well-nourished. No distress.  Neck: Neck supple.  Cardiovascular: Normal rate and regular rhythm.   Pulmonary/Chest: Breath sounds normal. No respiratory distress. She has no wheezes.  Abdominal: Soft. Bowel sounds are normal. There is no tenderness.  Musculoskeletal: She exhibits no edema or tenderness.  Lymphadenopathy:    She has no cervical adenopathy.    BP 128/72   Pulse 65   Temp 98.1 F (36.7 C) (Oral)   Ht 5\' 4"  (1.626 m)   Wt 153 lb 9.6 oz (69.7 kg)   LMP  (Exact Date) Comment: LMP 2010  SpO2 95%   BMI 26.37 kg/m  Wt Readings from Last 3 Encounters:  12/03/16 153 lb 9.6 oz (69.7 kg)  07/17/16 154 lb 4 oz (70 kg)  03/19/16 153 lb (69.4 kg)     Lab Results  Component Value Date   WBC 4.4 03/19/2016   HGB 14.3 03/19/2016   HCT 42.9 03/19/2016   PLT 203.0 03/19/2016   GLUCOSE 88 12/03/2016   CHOL 202 (H) 03/19/2016   TRIG 51.0 03/19/2016   HDL 76.00 03/19/2016   LDLDIRECT 131.9 10/26/2013   LDLCALC 116 (H) 03/19/2016   ALT 19 04/18/2016  AST 22 04/18/2016   NA 139 12/03/2016   K 4.9 12/03/2016   CL 104 12/03/2016   CREATININE 0.76 12/03/2016   BUN 16 12/03/2016   CO2 28 12/03/2016   TSH 1.48 09/14/2015    Nm Bone Scan Limited  Result Date: 10/09/2016 CLINICAL DATA:  Right rib pain.  Soreness. EXAM: NUCLEAR MEDICINE BONE LIMITED TECHNIQUE: After intravenous administration of radiopharmaceutical, delayed planar images were obtained in multiple projections through the limited area of interest. RADIOPHARMACEUTICALS:  123456 millicuries technetium 99 M MDP. COMPARISON:  No recent prior. FINDINGS: Bilateral renal function excretion. No focal rib lesion. No focal abnormality identified. IMPRESSION: No acute or focal abnormality identified. Specifically the right rib region is normal. Electronically Signed   By: Marcello Moores  Register   On: 10/09/2016 14:08       Assessment & Plan:   Problem List Items Addressed This Visit     Osteopenia - Primary    T score -2.3.  Discussed treatment options and risk/side effects of medication.  Pt desires to start reclast.  Form completed.        Relevant Orders   Urinalysis, Routine w reflex microscopic (Completed)   Basic metabolic panel (Completed)   VITAMIN D 25 Hydroxy (Vit-D Deficiency, Fractures) (Completed)       Einar Pheasant, MD

## 2016-12-03 NOTE — Progress Notes (Signed)
Pre visit review using our clinic review tool, if applicable. No additional management support is needed unless otherwise documented below in the visit note. 

## 2016-12-04 ENCOUNTER — Encounter: Payer: Self-pay | Admitting: Internal Medicine

## 2016-12-16 ENCOUNTER — Encounter: Payer: Self-pay | Admitting: Internal Medicine

## 2016-12-16 DIAGNOSIS — M8580 Other specified disorders of bone density and structure, unspecified site: Secondary | ICD-10-CM | POA: Insufficient documentation

## 2016-12-16 NOTE — Assessment & Plan Note (Signed)
T score -2.3.  Discussed treatment options and risk/side effects of medication.  Pt desires to start reclast.  Form completed.

## 2016-12-27 ENCOUNTER — Other Ambulatory Visit: Payer: Self-pay | Admitting: Internal Medicine

## 2016-12-27 DIAGNOSIS — Z1231 Encounter for screening mammogram for malignant neoplasm of breast: Secondary | ICD-10-CM

## 2017-01-17 ENCOUNTER — Ambulatory Visit: Payer: BC Managed Care – PPO | Admitting: Family Medicine

## 2017-01-29 ENCOUNTER — Ambulatory Visit
Admission: RE | Admit: 2017-01-29 | Discharge: 2017-01-29 | Disposition: A | Discharge status: Home / Self Care | Payer: BC Managed Care – PPO | Source: Ambulatory Visit | Attending: Internal Medicine | Admitting: Internal Medicine

## 2017-01-29 DIAGNOSIS — Z1231 Encounter for screening mammogram for malignant neoplasm of breast: Secondary | ICD-10-CM | POA: Insufficient documentation

## 2017-01-29 HISTORY — DX: Malignant (primary) neoplasm, unspecified: C80.1

## 2017-03-20 ENCOUNTER — Encounter: Payer: BC Managed Care – PPO | Admitting: Internal Medicine

## 2017-04-18 ENCOUNTER — Encounter: Payer: BC Managed Care – PPO | Admitting: Internal Medicine

## 2017-07-14 ENCOUNTER — Encounter: Payer: Self-pay | Admitting: Internal Medicine

## 2017-07-14 ENCOUNTER — Ambulatory Visit (INDEPENDENT_AMBULATORY_CARE_PROVIDER_SITE_OTHER): Payer: BC Managed Care – PPO | Admitting: Internal Medicine

## 2017-07-14 VITALS — BP 130/76 | HR 61 | Temp 98.7°F | Resp 12 | Ht 64.0 in | Wt 146.8 lb

## 2017-07-14 DIAGNOSIS — M25562 Pain in left knee: Secondary | ICD-10-CM

## 2017-07-14 DIAGNOSIS — D329 Benign neoplasm of meninges, unspecified: Secondary | ICD-10-CM

## 2017-07-14 DIAGNOSIS — M8580 Other specified disorders of bone density and structure, unspecified site: Secondary | ICD-10-CM

## 2017-07-14 DIAGNOSIS — Z86010 Personal history of colonic polyps: Secondary | ICD-10-CM | POA: Diagnosis not present

## 2017-07-14 DIAGNOSIS — E7800 Pure hypercholesterolemia, unspecified: Secondary | ICD-10-CM | POA: Diagnosis not present

## 2017-07-14 DIAGNOSIS — C439 Malignant melanoma of skin, unspecified: Secondary | ICD-10-CM

## 2017-07-14 DIAGNOSIS — Z0000 Encounter for general adult medical examination without abnormal findings: Secondary | ICD-10-CM

## 2017-07-14 DIAGNOSIS — Z23 Encounter for immunization: Secondary | ICD-10-CM | POA: Diagnosis not present

## 2017-07-14 DIAGNOSIS — Z1159 Encounter for screening for other viral diseases: Secondary | ICD-10-CM

## 2017-07-14 MED ORDER — TETANUS-DIPHTH-ACELL PERTUSSIS 5-2.5-18.5 LF-MCG/0.5 IM SUSP: 0.5000 mL | Freq: Once | INTRAMUSCULAR | 0 refills | Status: AC

## 2017-07-14 MED ORDER — ZOSTER VAC RECOMB ADJUVANTED 50 MCG/0.5ML IM SUSR: 0.5000 mL | Freq: Once | INTRAMUSCULAR | 0 refills | Status: AC

## 2017-07-14 NOTE — Progress Notes (Signed)
Patient ID: Carla Horne, female   DOB: 11-Sep-1956, 61 y.o.   MRN: [id number]   Subjective:    Patient ID: Carla Horne, female    DOB: 1956-08-22, 61 y.o.   MRN: [id number]  HPI  Patient here for her physical exam.  She reports she is doing relatively well.  Is exercising.  Tries to stay active.  No chest pain.  No sob.  No acid reflux.  No abdominal pain.  Bowels moving.  Saw ortho for her left knee.  Previous visit, discussed bone density and treatment options.  Had decided on reclast.  Discussed today.  She declines reclast now.     Past Medical History:  Diagnosis Date  . Cancer (H[facility])    melanoma- leg  . Meningioma [facility])    Past Surgical History:  Procedure Laterality Date  . Bladder Mesh Implant..1990    . BREAST BIOPSY Left 20+ yrs ago   core - neg  . BREAST SURGERY  1990   breast biopsy  . Remval of mengionoma  2010   Family History  Problem Relation Age of Onset  . Hypercholesterolemia Father   . Hypertension Father   . Breast cancer Paternal Grandmother   . Colon cancer Neg Hx    Social History   Social History  . Marital status: Married    Spouse name: N/A  . Number of children: 2  . Years of education: N/A   Occupational History  . retired Pharmacist, hospital    Social History Main Topics  . Smoking status: Never Smoker  . Smokeless tobacco: Never Used  . Alcohol use No  . Drug use: No  . Sexual activity: Not Asked     Comment: LMP 2010   Other Topics Concern  . None   Social History Narrative  . None    Outpatient Encounter Prescriptions as of 07/14/2017  Medication Sig  . meloxicam (MOBIC) 15 MG tablet   . [EXPIRED] Tdap (BOOSTRIX) 5-2.5-18.5 LF-MCG/0.5 injection Inject 0.5 mLs into the muscle once.  . [EXPIRED] Zoster Vac Recomb Adjuvanted (SHINGRIX) injection Inject 0.5 mLs into the muscle once.   No facility-administered encounter medications on file as of 07/14/2017.     Review of Systems  Constitutional: Negative for appetite change and  unexpected weight change.  HENT: Negative for congestion and sinus pressure.   Eyes: Negative for pain and visual disturbance.  Respiratory: Negative for cough, chest tightness and shortness of breath.   Cardiovascular: Negative for chest pain, palpitations and leg swelling.  Gastrointestinal: Negative for abdominal pain, diarrhea, nausea and vomiting.  Genitourinary: Negative for difficulty urinating and dysuria.  Musculoskeletal: Negative for back pain and joint swelling.  Skin: Negative for color change and rash.  Neurological: Negative for dizziness, light-headedness and headaches.  Hematological: Negative for adenopathy. Does not bruise/bleed easily.  Psychiatric/Behavioral: Negative for agitation and dysphoric mood.       Objective:    Physical Exam  Constitutional: She is oriented to person, place, and time. She appears well-developed and well-nourished. No distress.  HENT:  Nose: Nose normal.  Mouth/Throat: Oropharynx is clear and moist.  Eyes: Right eye exhibits no discharge. Left eye exhibits no discharge. No scleral icterus.  Neck: Neck supple. No thyromegaly present.  Cardiovascular: Normal rate and regular rhythm.   Pulmonary/Chest: Breath sounds normal. No accessory muscle usage. No tachypnea. No respiratory distress. She has no decreased breath sounds. She has no wheezes. She has no rhonchi. Right breast exhibits no inverted nipple, no mass, no  nipple discharge and no tenderness (no axillary adenopathy). Left breast exhibits no inverted nipple, no mass, no nipple discharge and no tenderness (no axilarry adenopathy).  Abdominal: Soft. Bowel sounds are normal. There is no tenderness.  Musculoskeletal: She exhibits no edema or tenderness.  Lymphadenopathy:    She has no cervical adenopathy.  Neurological: She is alert and oriented to person, place, and time.  Skin: Skin is warm. No rash noted. No erythema.  Psychiatric: She has a normal mood and affect. Her behavior is  normal.    BP 130/76 (BP Location: Left Arm, Patient Position: Sitting, Cuff Size: Normal)   Pulse 61   Temp 98.7 F (37.1 C) (Oral)   Resp 12   Ht 5\' 4"  (1.626 m)   Wt 146 lb 12.8 oz (66.6 kg)   SpO2 99%   BMI 25.20 kg/m  Wt Readings from Last 3 Encounters:  07/14/17 146 lb 12.8 oz (66.6 kg)  12/03/16 153 lb 9.6 oz (69.7 kg)  07/17/16 154 lb 4 oz (70 kg)     Lab Results  Component Value Date   WBC 5.0 07/14/2017   HGB 14.7 07/14/2017   HCT 44.0 07/14/2017   PLT 209.0 07/14/2017   GLUCOSE 82 07/14/2017   CHOL 218 (H) 07/14/2017   TRIG 55.0 07/14/2017   HDL 71.00 07/14/2017   LDLDIRECT 131.9 10/26/2013   LDLCALC 136 (H) 07/14/2017   ALT 13 07/14/2017   AST 21 07/14/2017   NA 139 07/14/2017   K 4.5 07/14/2017   CL 102 07/14/2017   CREATININE 0.78 07/14/2017   BUN 14 07/14/2017   CO2 29 07/14/2017   TSH 0.82 07/14/2017    Mm Screening Breast Tomo Bilateral  Result Date: 01/29/2017 CLINICAL DATA:  Screening. EXAM: 2D DIGITAL SCREENING BILATERAL MAMMOGRAM WITH CAD AND ADJUNCT TOMO COMPARISON:  Previous exam(s). ACR Breast Density Category b: There are scattered areas of fibroglandular density. FINDINGS: There are no findings suspicious for malignancy. Images were processed with CAD. IMPRESSION: No mammographic evidence of malignancy. A result letter of this screening mammogram will be mailed directly to the patient. RECOMMENDATION: Screening mammogram in one year. (Code:SM-B-01Y) BI-RADS CATEGORY  1: Negative. Electronically Signed   By: Nolon Nations M.D.   On: 01/29/2017 15:12       Assessment & Plan:   Problem List Items Addressed This Visit    Health care maintenance    Physical today 07/14/17.  colonosocpy 03/10/14.  Recommended f/u colonoscopy in 02/2017.  Mammogram 01/29/17 - Birads I.  PAP 03/19/16.        History of colonic polyps    Colonoscopy 02/2014.  As outlined.  Recommended f/u colonoscopy in 2018.  She will call and schedule.  She has receive notice.         Hyperbilirubinemia    Had previous abdominal ultrasound.  Negative.  Recheck liver panel.        Relevant Orders   Hepatic function panel (Completed)   Hypercholesterolemia - Primary    Low cholesterol diet and exercise.  Follow lipid panel.        Relevant Orders   CBC with Differential/Platelet (Completed)   TSH (Completed)   Lipid panel (Completed)   Basic metabolic panel (Completed)   Knee pain, left    Just saw ortho.  Stable.       Melanoma of skin ([place])    Followed by dermatology.        Meningioma ([place])    Followed by Dr Inez Pilgrim at  [facility].  Stable.  Has f/u scheduled.        Osteopenia    Discussed bone density results last visit.  Discussed treatment options.  Discussed with her today.  She wants to hold on reclast at this time.  Weight bearing exercise.  Follow.         Other Visit Diagnoses    Need for hepatitis C screening test       Relevant Orders   Hepatitis C antibody (Completed)   Need for Tdap vaccination           Einar Pheasant, MD

## 2017-07-14 NOTE — Progress Notes (Signed)
Pre-visit discussion using our clinic review tool. No additional management support is needed unless otherwise documented below in the visit note.  

## 2017-07-14 NOTE — Assessment & Plan Note (Signed)
Physical today 07/14/17.  colonosocpy 03/10/14.  Recommended f/u colonoscopy in 02/2017.  Mammogram 01/29/17 - Birads I.  PAP 03/19/16.

## 2017-07-15 ENCOUNTER — Encounter: Payer: Self-pay | Admitting: Internal Medicine

## 2017-07-15 LAB — CBC WITH DIFFERENTIAL/PLATELET
Basophils Absolute: 0.1 10*3/uL (ref 0.0–0.1)
Basophils Relative: 1.4 % (ref 0.0–3.0)
EOS PCT: 2.3 % (ref 0.0–5.0)
Eosinophils Absolute: 0.1 10*3/uL (ref 0.0–0.7)
HCT: 44 % (ref 36.0–46.0)
Hemoglobin: 14.7 g/dL (ref 12.0–15.0)
LYMPHS ABS: 1.5 10*3/uL (ref 0.7–4.0)
Lymphocytes Relative: 30.9 % (ref 12.0–46.0)
MCHC: 33.4 g/dL (ref 30.0–36.0)
MCV: 97.5 fl (ref 78.0–100.0)
MONOS PCT: 8.6 % (ref 3.0–12.0)
Monocytes Absolute: 0.4 10*3/uL (ref 0.1–1.0)
NEUTROS ABS: 2.8 10*3/uL (ref 1.4–7.7)
NEUTROS PCT: 56.8 % (ref 43.0–77.0)
PLATELETS: 209 10*3/uL (ref 150.0–400.0)
RBC: 4.51 Mil/uL (ref 3.87–5.11)
RDW: 13.7 % (ref 11.5–15.5)
WBC: 5 10*3/uL (ref 4.0–10.5)

## 2017-07-15 LAB — HEPATIC FUNCTION PANEL
ALK PHOS: 67 U/L (ref 39–117)
ALT: 13 U/L (ref 0–35)
AST: 21 U/L (ref 0–37)
Albumin: 4.5 g/dL (ref 3.5–5.2)
BILIRUBIN TOTAL: 1.7 mg/dL — AB (ref 0.2–1.2)
Bilirubin, Direct: 0.2 mg/dL (ref 0.0–0.3)
Total Protein: 7.3 g/dL (ref 6.0–8.3)

## 2017-07-15 LAB — BASIC METABOLIC PANEL
BUN: 14 mg/dL (ref 6–23)
CALCIUM: 9.9 mg/dL (ref 8.4–10.5)
CHLORIDE: 102 meq/L (ref 96–112)
CO2: 29 mEq/L (ref 19–32)
CREATININE: 0.78 mg/dL (ref 0.40–1.20)
GFR: 80.04 mL/min (ref >60.00)
GLUCOSE: 82 mg/dL (ref 70–99)
Potassium: 4.5 mEq/L (ref 3.5–5.1)
Sodium: 139 mEq/L (ref 135–145)

## 2017-07-15 LAB — LIPID PANEL
CHOL/HDL RATIO: 3
Cholesterol: 218 mg/dL — ABNORMAL HIGH (ref 0–200)
HDL: 71 mg/dL (ref >39.00)
LDL Cholesterol: 136 mg/dL — ABNORMAL HIGH (ref 0–99)
NONHDL: 146.73
Triglycerides: 55 mg/dL (ref 0.0–149.0)
VLDL: 11 mg/dL (ref 0.0–40.0)

## 2017-07-15 LAB — HEPATITIS C ANTIBODY: HCV Ab: NEGATIVE

## 2017-07-15 LAB — TSH: TSH: 0.82 u[IU]/mL (ref 0.35–4.50)

## 2017-07-15 NOTE — Assessment & Plan Note (Signed)
Had previous abdominal ultrasound.  Negative.  Recheck liver panel.

## 2017-07-15 NOTE — Assessment & Plan Note (Signed)
Colonoscopy 02/2014.  As outlined.  Recommended f/u colonoscopy in 2018.  She will call and schedule.  She has receive notice.

## 2017-07-15 NOTE — Assessment & Plan Note (Signed)
Discussed bone density results last visit.  Discussed treatment options.  Discussed with her today.  She wants to hold on reclast at this time.  Weight bearing exercise.  Follow.

## 2017-07-15 NOTE — Assessment & Plan Note (Signed)
Low cholesterol diet and exercise.  Follow lipid panel.   

## 2017-07-15 NOTE — Assessment & Plan Note (Signed)
Followed by dermatology

## 2017-07-15 NOTE — Assessment & Plan Note (Signed)
Followed by Dr Inez Pilgrim at [facility].  Stable.  Has f/u scheduled.

## 2017-07-15 NOTE — Assessment & Plan Note (Signed)
Just saw ortho.  Stable.

## 2017-07-17 ENCOUNTER — Encounter: Payer: Self-pay | Admitting: Internal Medicine

## 2017-07-17 ENCOUNTER — Other Ambulatory Visit: Payer: Self-pay | Admitting: Internal Medicine

## 2017-07-17 NOTE — Progress Notes (Signed)
Order placed for f/u lab.   

## 2017-07-17 NOTE — Telephone Encounter (Signed)
See result note sent to [facility].  Low cholesterol diet and exercise.  Does not need medication at this time.

## 2017-08-11 ENCOUNTER — Other Ambulatory Visit: Payer: BC Managed Care – PPO

## 2017-08-27 ENCOUNTER — Other Ambulatory Visit (INDEPENDENT_AMBULATORY_CARE_PROVIDER_SITE_OTHER): Payer: BC Managed Care – PPO

## 2017-08-27 LAB — HEPATIC FUNCTION PANEL
ALK PHOS: 62 U/L (ref 39–117)
ALT: 13 U/L (ref 0–35)
AST: 23 U/L (ref 0–37)
Albumin: 4.4 g/dL (ref 3.5–5.2)
BILIRUBIN DIRECT: 0.3 mg/dL (ref 0.0–0.3)
BILIRUBIN TOTAL: 1.7 mg/dL — AB (ref 0.2–1.2)
Total Protein: 6.5 g/dL (ref 6.0–8.3)

## 2017-08-28 ENCOUNTER — Other Ambulatory Visit: Payer: Self-pay | Admitting: Internal Medicine

## 2017-08-28 DIAGNOSIS — E7800 Pure hypercholesterolemia, unspecified: Secondary | ICD-10-CM

## 2017-08-28 NOTE — Progress Notes (Signed)
Order placed for f/u liver panel.  

## 2017-08-28 NOTE — Progress Notes (Signed)
Order placed for lipid panel.

## 2017-10-08 ENCOUNTER — Other Ambulatory Visit: Payer: BC Managed Care – PPO

## 2017-10-09 ENCOUNTER — Other Ambulatory Visit (INDEPENDENT_AMBULATORY_CARE_PROVIDER_SITE_OTHER): Payer: BC Managed Care – PPO

## 2017-10-09 DIAGNOSIS — E7800 Pure hypercholesterolemia, unspecified: Secondary | ICD-10-CM

## 2017-10-09 LAB — HEPATIC FUNCTION PANEL
ALBUMIN: 4.4 g/dL (ref 3.5–5.2)
ALK PHOS: 67 U/L (ref 39–117)
ALT: 12 U/L (ref 0–35)
AST: 20 U/L (ref 0–37)
BILIRUBIN DIRECT: 0.2 mg/dL (ref 0.0–0.3)
BILIRUBIN TOTAL: 1.5 mg/dL — AB (ref 0.2–1.2)
Total Protein: 7 g/dL (ref 6.0–8.3)

## 2017-10-09 LAB — LIPID PANEL
CHOL/HDL RATIO: 3
Cholesterol: 192 mg/dL (ref 0–200)
HDL: 61.2 mg/dL (ref >39.00)
LDL Cholesterol: 119 mg/dL — ABNORMAL HIGH (ref 0–99)
NONHDL: 131.03
Triglycerides: 61 mg/dL (ref 0.0–149.0)
VLDL: 12.2 mg/dL (ref 0.0–40.0)

## 2017-10-13 ENCOUNTER — Encounter: Payer: Self-pay | Admitting: Internal Medicine

## 2018-03-02 ENCOUNTER — Other Ambulatory Visit: Payer: Self-pay | Admitting: Internal Medicine

## 2018-03-02 DIAGNOSIS — Z1231 Encounter for screening mammogram for malignant neoplasm of breast: Secondary | ICD-10-CM

## 2018-04-01 ENCOUNTER — Ambulatory Visit
Admission: RE | Admit: 2018-04-01 | Discharge: 2018-04-01 | Disposition: A | Discharge status: Home / Self Care | Payer: BC Managed Care – PPO | Source: Ambulatory Visit | Attending: Internal Medicine | Admitting: Internal Medicine

## 2018-04-01 DIAGNOSIS — Z1231 Encounter for screening mammogram for malignant neoplasm of breast: Secondary | ICD-10-CM | POA: Insufficient documentation

## 2018-09-14 ENCOUNTER — Other Ambulatory Visit (HOSPITAL_COMMUNITY)
Admission: RE | Admit: 2018-09-14 | Discharge: 2018-09-14 | Disposition: A | Discharge status: Home / Self Care | Payer: BC Managed Care – PPO | Source: Ambulatory Visit | Attending: Internal Medicine | Admitting: Internal Medicine

## 2018-09-14 ENCOUNTER — Ambulatory Visit (INDEPENDENT_AMBULATORY_CARE_PROVIDER_SITE_OTHER): Payer: BC Managed Care – PPO | Admitting: Internal Medicine

## 2018-09-14 ENCOUNTER — Encounter: Payer: Self-pay | Admitting: Internal Medicine

## 2018-09-14 VITALS — BP 134/62 | HR 67 | Temp 98.6°F | Resp 18 | Ht 64.0 in | Wt 135.8 lb

## 2018-09-14 DIAGNOSIS — Z111 Encounter for screening for respiratory tuberculosis: Secondary | ICD-10-CM

## 2018-09-14 DIAGNOSIS — Z0000 Encounter for general adult medical examination without abnormal findings: Secondary | ICD-10-CM

## 2018-09-14 DIAGNOSIS — Z124 Encounter for screening for malignant neoplasm of cervix: Secondary | ICD-10-CM

## 2018-09-14 DIAGNOSIS — Z1151 Encounter for screening for human papillomavirus (HPV): Secondary | ICD-10-CM | POA: Diagnosis not present

## 2018-09-14 DIAGNOSIS — D329 Benign neoplasm of meninges, unspecified: Secondary | ICD-10-CM

## 2018-09-14 DIAGNOSIS — E7800 Pure hypercholesterolemia, unspecified: Secondary | ICD-10-CM

## 2018-09-14 DIAGNOSIS — C439 Malignant melanoma of skin, unspecified: Secondary | ICD-10-CM

## 2018-09-14 DIAGNOSIS — M25562 Pain in left knee: Secondary | ICD-10-CM

## 2018-09-14 NOTE — Progress Notes (Signed)
Patient ID: Carla Horne, female   DOB: 1956-08-05, 62 y.o.   MRN: [id number]   Subjective:    Patient ID: Carla Horne, female    DOB: [birth year], 62 y.o.   MRN: [id number]  HPI  Patient here for her physical exam.  She reports she is doing relatively well.  Stays active.  Exercises.  No chest pain.  No sob.  No acid reflux.  No abdominal pain.  Bowels moving.  Takes meloxicam prn for knee pain.  Helps.  Feels needs to have on a prn basis.  Overdue colonoscopy.     Past Medical History:  Diagnosis Date  . Cancer (HCC)    melanoma- leg  . Meningioma [facility])    Past Surgical History:  Procedure Laterality Date  . Bladder Mesh Implant..1990    . BREAST BIOPSY Left 20+ yrs ago   core - neg  . BREAST SURGERY  1990   breast biopsy  . Remval of mengionoma  2010   Family History  Problem Relation Age of Onset  . Hypercholesterolemia Father   . Hypertension Father   . Breast cancer Paternal Grandmother   . Colon cancer Neg Hx    Social History   Socioeconomic History  . Marital status: Married    Spouse name: Not on file  . Number of children: 2  . Years of education: Not on file  . Highest education level: Not on file  Occupational History  . Occupation: retired Tour manager  . Financial resource strain: Not on file  . Food insecurity:    Worry: Not on file    Inability: Not on file  . Transportation needs:    Medical: Not on file    Non-medical: Not on file  Tobacco Use  . Smoking status: Never Smoker  . Smokeless tobacco: Never Used  Substance and Sexual Activity  . Alcohol use: No    Alcohol/week: 0.0 standard drinks  . Drug use: No  . Sexual activity: Not on file    Comment: LMP 2010  Lifestyle  . Physical activity:    Days per week: Not on file    Minutes per session: Not on file  . Stress: Not on file  Relationships  . Social connections:    Talks on phone: Not on file    Gets together: Not on file    Attends religious service: Not on file   Active member of club or organization: Not on file    Attends meetings of clubs or organizations: Not on file    Relationship status: Not on file  Other Topics Concern  . Not on file  Social History Narrative  . Not on file    Outpatient Encounter Medications as of 09/14/2018  Medication Sig  . meloxicam (MOBIC) 15 MG tablet Take 1 tablet (15 mg total) by mouth daily as needed for pain.  . [DISCONTINUED] meloxicam (MOBIC) 15 MG tablet    No facility-administered encounter medications on file as of 09/14/2018.     Review of Systems  Constitutional: Negative for appetite change and unexpected weight change.  HENT: Negative for congestion and sinus pressure.   Eyes: Negative for pain and visual disturbance.  Respiratory: Negative for cough, chest tightness and shortness of breath.   Cardiovascular: Negative for chest pain, palpitations and leg swelling.  Gastrointestinal: Negative for abdominal pain, diarrhea, nausea and vomiting.  Genitourinary: Negative for difficulty urinating and dysuria.  Musculoskeletal: Negative for myalgias.  Knee pain as outlined.    Skin: Negative for color change and rash.  Neurological: Negative for dizziness, light-headedness and headaches.  Hematological: Negative for adenopathy. Does not bruise/bleed easily.  Psychiatric/Behavioral: Negative for agitation and dysphoric mood.       Objective:    Physical Exam  Constitutional: She is oriented to person, place, and time. She appears well-developed and well-nourished. No distress.  HENT:  Nose: Nose normal.  Mouth/Throat: Oropharynx is clear and moist.  Eyes: Right eye exhibits no discharge. Left eye exhibits no discharge. No scleral icterus.  Neck: Neck supple. No thyromegaly present.  Cardiovascular: Normal rate and regular rhythm.  Pulmonary/Chest: Breath sounds normal. No accessory muscle usage. No tachypnea. No respiratory distress. She has no decreased breath sounds. She has no wheezes.  She has no rhonchi. Right breast exhibits no inverted nipple, no mass, no nipple discharge and no tenderness (no axillary adenopathy). Left breast exhibits no inverted nipple, no mass, no nipple discharge and no tenderness (no axilarry adenopathy).  Abdominal: Soft. Bowel sounds are normal. There is no tenderness.  Genitourinary:  Genitourinary Comments: Normal external genitalia.  Vaginal vault without lesions.  Cervix identified.  Pap smear performed.  Could not appreciate any adnexal masses or tenderness.    Musculoskeletal: She exhibits no edema or tenderness.  Lymphadenopathy:    She has no cervical adenopathy.  Neurological: She is alert and oriented to person, place, and time.  Skin: No rash noted. No erythema.  Psychiatric: She has a normal mood and affect. Her behavior is normal.    BP 134/62 (BP Location: Left Arm, Patient Position: Sitting, Cuff Size: Normal)   Pulse 67   Temp 98.6 F (37 C) (Oral)   Resp 18   Ht 5\' 4"  (1.626 m)   Wt 135 lb 12.8 oz (61.6 kg)   SpO2 98%   BMI 23.31 kg/m  Wt Readings from Last 3 Encounters:  09/14/18 135 lb 12.8 oz (61.6 kg)  07/14/17 146 lb 12.8 oz (66.6 kg)  12/03/16 153 lb 9.6 oz (69.7 kg)     Lab Results  Component Value Date   WBC 3.6 (L) 09/17/2018   HGB 14.8 09/17/2018   HCT 43.8 09/17/2018   PLT 208.0 09/17/2018   GLUCOSE 82 09/17/2018   CHOL 204 (H) 09/17/2018   TRIG 52.0 09/17/2018   HDL 72.40 09/17/2018   LDLDIRECT 131.9 10/26/2013   LDLCALC 121 (H) 09/17/2018   ALT 15 09/17/2018   AST 20 09/17/2018   NA 140 09/17/2018   K 4.5 09/17/2018   CL 103 09/17/2018   CREATININE 0.84 09/17/2018   BUN 19 09/17/2018   CO2 31 09/17/2018   TSH 1.22 09/17/2018    Mm Screening Breast Tomo Bilateral  Result Date: 04/01/2018 CLINICAL DATA:  Screening. EXAM: DIGITAL SCREENING BILATERAL MAMMOGRAM WITH TOMO AND CAD COMPARISON:  Previous exam(s). ACR Breast Density Category c: The breast tissue is heterogeneously dense, which may  obscure small masses. FINDINGS: There are no findings suspicious for malignancy. Images were processed with CAD. IMPRESSION: No mammographic evidence of malignancy. A result letter of this screening mammogram will be mailed directly to the patient. RECOMMENDATION: Screening mammogram in one year. (Code:SM-B-01Y) BI-RADS CATEGORY  1: Negative. Electronically Signed   By: Ammie Ferrier M.D.   On: 04/01/2018 13:34       Assessment & Plan:   Problem List Items Addressed This Visit    Health care maintenance    Physical today 09/14/18.  Colonoscopy 03/10/14.  Recommended f/u  02/2017.  Overdue.  Mammogram 04/01/18 - Birads I.  PAP 03/19/16.  Repeat today.  Refer to GI for f/u colonoscopy.        Hypercholesterolemia    Low cholesterol diet and exercise.  Follow lipid panel.        Knee pain, left    History of left knee arthroscopy with partial medial meniscectomy and chondroplasty 06/2014.  Seeing ortho.  meloxicam prn.  Stable.        Melanoma of skin ([place])    Followed by dermatology.       Meningioma [facility])    Has been followed by Dr Inez Pilgrim.        Other Visit Diagnoses    Routine general medical examination at a health care facility    -  Primary   Cervical cancer screening       Relevant Orders   Cytology - PAP (Completed)   Tuberculosis screening       Relevant Orders   PPD (Completed)       Einar Pheasant, MD

## 2018-09-14 NOTE — Assessment & Plan Note (Addendum)
Physical today 09/14/18.  Colonoscopy 03/10/14.  Recommended f/u 02/2017.  Overdue.  Mammogram 04/01/18 - Birads I.  PAP 03/19/16.  Repeat today.  Refer to GI for f/u colonoscopy.

## 2018-09-15 ENCOUNTER — Encounter: Payer: Self-pay | Admitting: Internal Medicine

## 2018-09-15 LAB — CYTOLOGY - PAP
Diagnosis: NEGATIVE
HPV (WINDOPATH): NOT DETECTED

## 2018-09-15 MED ORDER — MELOXICAM 15 MG PO TABS: 15.0000 mg | ORAL_TABLET | Freq: Every day | ORAL | 1 refills | Status: DC | PRN

## 2018-09-16 ENCOUNTER — Other Ambulatory Visit: Payer: Self-pay | Admitting: Internal Medicine

## 2018-09-16 ENCOUNTER — Telehealth: Payer: Self-pay | Admitting: Radiology

## 2018-09-16 DIAGNOSIS — E7800 Pure hypercholesterolemia, unspecified: Secondary | ICD-10-CM

## 2018-09-16 NOTE — Progress Notes (Signed)
Order placed for f/u labs.  

## 2018-09-16 NOTE — Telephone Encounter (Signed)
Order placed for f/u labs.  

## 2018-09-16 NOTE — Telephone Encounter (Signed)
Pt coming in for labs tomorrow, please place future orders. Thank you.  

## 2018-09-17 ENCOUNTER — Other Ambulatory Visit (INDEPENDENT_AMBULATORY_CARE_PROVIDER_SITE_OTHER): Payer: BC Managed Care – PPO

## 2018-09-17 ENCOUNTER — Telehealth: Payer: Self-pay

## 2018-09-17 ENCOUNTER — Ambulatory Visit: Payer: BC Managed Care – PPO

## 2018-09-17 ENCOUNTER — Other Ambulatory Visit: Payer: Self-pay | Admitting: Internal Medicine

## 2018-09-17 DIAGNOSIS — E7800 Pure hypercholesterolemia, unspecified: Secondary | ICD-10-CM

## 2018-09-17 DIAGNOSIS — Z0000 Encounter for general adult medical examination without abnormal findings: Secondary | ICD-10-CM

## 2018-09-17 DIAGNOSIS — Z111 Encounter for screening for respiratory tuberculosis: Secondary | ICD-10-CM

## 2018-09-17 LAB — BASIC METABOLIC PANEL
BUN: 19 mg/dL (ref 6–23)
CHLORIDE: 103 meq/L (ref 96–112)
CO2: 31 meq/L (ref 19–32)
CREATININE: 0.84 mg/dL (ref 0.40–1.20)
Calcium: 9.5 mg/dL (ref 8.4–10.5)
GFR: 73.19 mL/min (ref >60.00)
Glucose, Bld: 82 mg/dL (ref 70–99)
Potassium: 4.5 mEq/L (ref 3.5–5.1)
Sodium: 140 mEq/L (ref 135–145)

## 2018-09-17 LAB — CBC WITH DIFFERENTIAL/PLATELET
BASOS PCT: 1 % (ref 0.0–3.0)
Basophils Absolute: 0 10*3/uL (ref 0.0–0.1)
EOS PCT: 4 % (ref 0.0–5.0)
Eosinophils Absolute: 0.1 10*3/uL (ref 0.0–0.7)
HCT: 43.8 % (ref 36.0–46.0)
Hemoglobin: 14.8 g/dL (ref 12.0–15.0)
LYMPHS PCT: 31.5 % (ref 12.0–46.0)
Lymphs Abs: 1.1 10*3/uL (ref 0.7–4.0)
MCHC: 33.8 g/dL (ref 30.0–36.0)
MCV: 95.8 fl (ref 78.0–100.0)
Monocytes Absolute: 0.4 10*3/uL (ref 0.1–1.0)
Monocytes Relative: 12 % (ref 3.0–12.0)
Neutro Abs: 1.9 10*3/uL (ref 1.4–7.7)
Neutrophils Relative %: 51.5 % (ref 43.0–77.0)
Platelets: 208 10*3/uL (ref 150.0–400.0)
RBC: 4.58 Mil/uL (ref 3.87–5.11)
RDW: 13.3 % (ref 11.5–15.5)
WBC: 3.6 10*3/uL — AB (ref 4.0–10.5)

## 2018-09-17 LAB — HEPATIC FUNCTION PANEL
ALT: 15 U/L (ref 0–35)
AST: 20 U/L (ref 0–37)
Albumin: 4.4 g/dL (ref 3.5–5.2)
Alkaline Phosphatase: 64 U/L (ref 39–117)
BILIRUBIN TOTAL: 1.3 mg/dL — AB (ref 0.2–1.2)
Bilirubin, Direct: 0.2 mg/dL (ref 0.0–0.3)
Total Protein: 6.8 g/dL (ref 6.0–8.3)

## 2018-09-17 LAB — LIPID PANEL
Cholesterol: 204 mg/dL — ABNORMAL HIGH (ref 0–200)
HDL: 72.4 mg/dL (ref >39.00)
LDL CALC: 121 mg/dL — AB (ref 0–99)
NONHDL: 131.77
Total CHOL/HDL Ratio: 3
Triglycerides: 52 mg/dL (ref 0.0–149.0)
VLDL: 10.4 mg/dL (ref 0.0–40.0)

## 2018-09-17 LAB — TB SKIN TEST
Induration: 0 mm
TB SKIN TEST: NEGATIVE

## 2018-09-17 LAB — TSH: TSH: 1.22 u[IU]/mL (ref 0.35–4.50)

## 2018-09-17 NOTE — Telephone Encounter (Signed)
Holding message until labs are resulted. Forwarding to Dr. Nicki Reaper since she has form and I will be out of the office.

## 2018-09-17 NOTE — Telephone Encounter (Signed)
FYI   Pt in for PPD read. Read at 9:05 AM on 09/17/2018 by Aceson Labell CMA. Pt had a negative PPD read. Pt would like form fax to the following number and mailed to her.  Fax number: [phone]

## 2018-09-17 NOTE — Telephone Encounter (Signed)
Will complete form once have lab results.

## 2018-09-17 NOTE — Progress Notes (Signed)
Pt in for PPD read. Read at 9:05 AM on 09/17/2018 by shelby CMA. Pt had a negative PPD read. Pt would like form fax to the following number and mailed to her.  Fax number: [phone]

## 2018-09-18 ENCOUNTER — Other Ambulatory Visit: Payer: Self-pay | Admitting: Internal Medicine

## 2018-09-18 DIAGNOSIS — D72819 Decreased white blood cell count, unspecified: Secondary | ICD-10-CM

## 2018-09-18 LAB — MEASLES/MUMPS/RUBELLA IMMUNITY
Mumps IgG: 72.9 AU/mL
RUBELLA: 1.56 {index}

## 2018-09-18 NOTE — Telephone Encounter (Signed)
Form completed & pt aware

## 2018-09-18 NOTE — Progress Notes (Signed)
Order placed for f/u cbc.   

## 2018-09-20 ENCOUNTER — Encounter: Payer: Self-pay | Admitting: Internal Medicine

## 2018-09-20 NOTE — Assessment & Plan Note (Signed)
History of left knee arthroscopy with partial medial meniscectomy and chondroplasty 06/2014.  Seeing ortho.  meloxicam prn.  Stable.

## 2018-09-20 NOTE — Assessment & Plan Note (Signed)
Has been followed by Dr Inez Pilgrim.

## 2018-09-20 NOTE — Assessment & Plan Note (Signed)
Followed by dermatology

## 2018-09-20 NOTE — Assessment & Plan Note (Signed)
Low cholesterol diet and exercise.  Follow lipid panel.   

## 2018-10-30 ENCOUNTER — Other Ambulatory Visit: Payer: BC Managed Care – PPO

## 2018-10-30 NOTE — Addendum Note (Signed)
Addended by: Arby Barrette on: 10/30/2018 09:37 AM   Modules accepted: Orders

## 2018-11-02 ENCOUNTER — Other Ambulatory Visit (INDEPENDENT_AMBULATORY_CARE_PROVIDER_SITE_OTHER): Payer: BC Managed Care – PPO

## 2018-11-02 DIAGNOSIS — D72819 Decreased white blood cell count, unspecified: Secondary | ICD-10-CM | POA: Diagnosis not present

## 2018-11-02 LAB — CBC WITH DIFFERENTIAL/PLATELET
BASOS PCT: 0.5 %
Basophils Absolute: 28 cells/uL (ref 0–200)
EOS PCT: 2 %
Eosinophils Absolute: 112 cells/uL (ref 15–500)
HCT: 43.4 % (ref 35.0–45.0)
Hemoglobin: 14.6 g/dL (ref 11.7–15.5)
Lymphs Abs: 1870 cells/uL (ref 850–3900)
MCH: 31.9 pg (ref 27.0–33.0)
MCHC: 33.6 g/dL (ref 32.0–36.0)
MCV: 95 fL (ref 80.0–100.0)
MONOS PCT: 9.9 %
MPV: 10.4 fL (ref 7.5–12.5)
Neutro Abs: 3035 cells/uL (ref 1500–7800)
Neutrophils Relative %: 54.2 %
PLATELETS: 259 10*3/uL (ref 140–400)
RBC: 4.57 10*6/uL (ref 3.80–5.10)
RDW: 12.3 % (ref 11.0–15.0)
Total Lymphocyte: 33.4 %
WBC mixed population: 554 cells/uL (ref 200–950)
WBC: 5.6 10*3/uL (ref 3.8–10.8)

## 2018-11-03 ENCOUNTER — Encounter: Payer: Self-pay | Admitting: Internal Medicine

## 2019-03-01 ENCOUNTER — Encounter: Payer: Self-pay | Admitting: Internal Medicine

## 2019-03-02 NOTE — Telephone Encounter (Signed)
Advised patient that she should not be traveling at this time. Also advised that I would not want to risk being around small children

## 2019-06-16 ENCOUNTER — Other Ambulatory Visit: Payer: Self-pay | Admitting: Internal Medicine

## 2019-06-16 DIAGNOSIS — Z1231 Encounter for screening mammogram for malignant neoplasm of breast: Secondary | ICD-10-CM

## 2019-07-26 ENCOUNTER — Ambulatory Visit
Admission: RE | Admit: 2019-07-26 | Discharge: 2019-07-26 | Disposition: A | Discharge status: Home / Self Care | Payer: BC Managed Care – PPO | Source: Ambulatory Visit | Attending: Internal Medicine | Admitting: Internal Medicine

## 2019-07-26 DIAGNOSIS — Z1231 Encounter for screening mammogram for malignant neoplasm of breast: Secondary | ICD-10-CM | POA: Diagnosis present

## 2019-09-14 ENCOUNTER — Other Ambulatory Visit: Payer: BC Managed Care – PPO

## 2019-09-17 ENCOUNTER — Encounter: Payer: BC Managed Care – PPO | Admitting: Internal Medicine

## 2019-10-26 ENCOUNTER — Other Ambulatory Visit: Payer: Self-pay

## 2019-10-27 ENCOUNTER — Telehealth: Payer: Self-pay | Admitting: *Deleted

## 2019-10-27 DIAGNOSIS — E7800 Pure hypercholesterolemia, unspecified: Secondary | ICD-10-CM

## 2019-10-27 NOTE — Telephone Encounter (Signed)
Orders placed for labs

## 2019-10-27 NOTE — Telephone Encounter (Signed)
Please place future orders for lab appt.  

## 2019-10-28 ENCOUNTER — Encounter: Payer: Self-pay | Admitting: Internal Medicine

## 2019-10-28 ENCOUNTER — Other Ambulatory Visit (INDEPENDENT_AMBULATORY_CARE_PROVIDER_SITE_OTHER): Payer: BC Managed Care – PPO

## 2019-10-28 ENCOUNTER — Other Ambulatory Visit: Payer: Self-pay

## 2019-10-28 DIAGNOSIS — E7800 Pure hypercholesterolemia, unspecified: Secondary | ICD-10-CM | POA: Diagnosis not present

## 2019-10-28 LAB — CBC WITH DIFFERENTIAL/PLATELET
Basophils Absolute: 0 10*3/uL (ref 0.0–0.1)
Basophils Relative: 0.6 % (ref 0.0–3.0)
Eosinophils Absolute: 0.1 10*3/uL (ref 0.0–0.7)
Eosinophils Relative: 2.9 % (ref 0.0–5.0)
HCT: 43.8 % (ref 36.0–46.0)
Hemoglobin: 14.5 g/dL (ref 12.0–15.0)
Lymphocytes Relative: 32.7 % (ref 12.0–46.0)
Lymphs Abs: 1.4 10*3/uL (ref 0.7–4.0)
MCHC: 33 g/dL (ref 30.0–36.0)
MCV: 97.4 fl (ref 78.0–100.0)
Monocytes Absolute: 0.4 10*3/uL (ref 0.1–1.0)
Monocytes Relative: 10.1 % (ref 3.0–12.0)
Neutro Abs: 2.4 10*3/uL (ref 1.4–7.7)
Neutrophils Relative %: 53.7 % (ref 43.0–77.0)
Platelets: 220 10*3/uL (ref 150.0–400.0)
RBC: 4.5 Mil/uL (ref 3.87–5.11)
RDW: 13.4 % (ref 11.5–15.5)
WBC: 4.4 10*3/uL (ref 4.0–10.5)

## 2019-10-28 LAB — LIPID PANEL
Cholesterol: 204 mg/dL — ABNORMAL HIGH (ref 0–200)
HDL: 68.5 mg/dL (ref >39.00)
LDL Cholesterol: 125 mg/dL — ABNORMAL HIGH (ref 0–99)
NonHDL: 135.66
Total CHOL/HDL Ratio: 3
Triglycerides: 51 mg/dL (ref 0.0–149.0)
VLDL: 10.2 mg/dL (ref 0.0–40.0)

## 2019-10-28 LAB — COMPREHENSIVE METABOLIC PANEL
ALT: 13 U/L (ref 0–35)
AST: 20 U/L (ref 0–37)
Albumin: 4.3 g/dL (ref 3.5–5.2)
Alkaline Phosphatase: 65 U/L (ref 39–117)
BUN: 20 mg/dL (ref 6–23)
CO2: 26 mEq/L (ref 19–32)
Calcium: 9 mg/dL (ref 8.4–10.5)
Chloride: 106 mEq/L (ref 96–112)
Creatinine, Ser: 0.9 mg/dL (ref 0.40–1.20)
GFR: 63.36 mL/min (ref >60.00)
Glucose, Bld: 101 mg/dL — ABNORMAL HIGH (ref 70–99)
Potassium: 4.2 mEq/L (ref 3.5–5.1)
Sodium: 139 mEq/L (ref 135–145)
Total Bilirubin: 1 mg/dL (ref 0.2–1.2)
Total Protein: 6.7 g/dL (ref 6.0–8.3)

## 2019-10-28 LAB — TSH: TSH: 1.31 u[IU]/mL (ref 0.35–4.50)

## 2019-11-01 ENCOUNTER — Other Ambulatory Visit: Payer: Self-pay

## 2019-11-01 ENCOUNTER — Ambulatory Visit (INDEPENDENT_AMBULATORY_CARE_PROVIDER_SITE_OTHER): Payer: BC Managed Care – PPO | Admitting: Internal Medicine

## 2019-11-01 VITALS — BP 110/70 | HR 70 | Temp 97.8°F | Resp 16 | Ht 65.0 in | Wt 141.8 lb

## 2019-11-01 DIAGNOSIS — E7800 Pure hypercholesterolemia, unspecified: Secondary | ICD-10-CM | POA: Diagnosis not present

## 2019-11-01 DIAGNOSIS — D329 Benign neoplasm of meninges, unspecified: Secondary | ICD-10-CM | POA: Diagnosis not present

## 2019-11-01 DIAGNOSIS — C439 Malignant melanoma of skin, unspecified: Secondary | ICD-10-CM | POA: Diagnosis not present

## 2019-11-01 DIAGNOSIS — Z0000 Encounter for general adult medical examination without abnormal findings: Secondary | ICD-10-CM

## 2019-11-01 NOTE — Progress Notes (Addendum)
Patient ID: Carla Horne, female   DOB: 1956-04-26, 63 y.o.   MRN: [id number]   Subjective:    Patient ID: Carla Horne, female    DOB: 06/26/1956, 63 y.o.   MRN: [id number]  HPI This visit occurred during the SARS-CoV-2 public health emergency.  Safety protocols were in place, including screening questions prior to the visit, additional usage of staff PPE, and extensive cleaning of exam room while observing appropriate contact time as indicated for disinfecting solutions.  Patient here for her physical exam.  She reports she is doing relatively well.  Trying to stay active.  No chest pain.  No sob.  No acid reflux.  No abdominal pain.  Bowels moving.  Saw GI.  Was scheduled for colonoscopy.  Discussed diet and exercise.     Past Medical History:  Diagnosis Date  . Cancer (HCC)    melanoma- leg  . Meningioma [facility])    Past Surgical History:  Procedure Laterality Date  . Bladder Mesh Implant..1990    . BREAST BIOPSY Left 20+ yrs ago   core - neg  . BREAST SURGERY  1990   breast biopsy  . Remval of mengionoma  2010   Family History  Problem Relation Age of Onset  . Hypercholesterolemia Father   . Hypertension Father   . Breast cancer Paternal Grandmother   . Colon cancer Neg Hx    Social History   Socioeconomic History  . Marital status: Married    Spouse name: Not on file  . Number of children: 2  . Years of education: Not on file  . Highest education level: Not on file  Occupational History  . Occupation: retired Tour manager  . Financial resource strain: Not on file  . Food insecurity    Worry: Not on file    Inability: Not on file  . Transportation needs    Medical: Not on file    Non-medical: Not on file  Tobacco Use  . Smoking status: Never Smoker  . Smokeless tobacco: Never Used  Substance and Sexual Activity  . Alcohol use: No    Alcohol/week: 0.0 standard drinks  . Drug use: No  . Sexual activity: Not on file    Comment: LMP 2010  Lifestyle  .  Physical activity    Days per week: Not on file    Minutes per session: Not on file  . Stress: Not on file  Relationships  . Social Herbalist on phone: Not on file    Gets together: Not on file    Attends religious service: Not on file    Active member of club or organization: Not on file    Attends meetings of clubs or organizations: Not on file    Relationship status: Not on file  Other Topics Concern  . Not on file  Social History Narrative  . Not on file    Outpatient Encounter Medications as of 11/01/2019  Medication Sig  . [DISCONTINUED] meloxicam (MOBIC) 15 MG tablet Take 1 tablet (15 mg total) by mouth daily as needed for pain.   No facility-administered encounter medications on file as of 11/01/2019.    Review of Systems  Constitutional: Negative for appetite change and unexpected weight change.  HENT: Negative for congestion and sinus pressure.   Eyes: Negative for pain and visual disturbance.  Respiratory: Negative for cough, chest tightness and shortness of breath.   Cardiovascular: Negative for chest pain, palpitations and leg swelling.  Gastrointestinal: Negative for abdominal pain, diarrhea, nausea and vomiting.  Genitourinary: Negative for difficulty urinating and dysuria.  Musculoskeletal: Negative for joint swelling and myalgias.  Skin: Negative for color change and rash.  Neurological: Negative for dizziness, light-headedness and headaches.  Hematological: Negative for adenopathy. Does not bruise/bleed easily.  Psychiatric/Behavioral: Negative for agitation and dysphoric mood.       Objective:    Physical Exam Constitutional:      General: She is not in acute distress.    Appearance: Normal appearance. She is well-developed.  HENT:     Head: Normocephalic and atraumatic.     Right Ear: External ear normal.     Left Ear: External ear normal.  Eyes:     General: No scleral icterus.       Right eye: No discharge.        Left eye: No  discharge.     Conjunctiva/sclera: Conjunctivae normal.  Neck:     Musculoskeletal: Neck supple. No muscular tenderness.     Thyroid: No thyromegaly.  Cardiovascular:     Rate and Rhythm: Normal rate and regular rhythm.  Pulmonary:     Effort: No tachypnea, accessory muscle usage or respiratory distress.     Breath sounds: Normal breath sounds. No decreased breath sounds or wheezing.  Chest:     Breasts:        Right: No inverted nipple, mass, nipple discharge or tenderness (no axillary adenopathy).        Left: No inverted nipple, mass, nipple discharge or tenderness (no axilarry adenopathy).  Abdominal:     General: Bowel sounds are normal.     Palpations: Abdomen is soft.     Tenderness: There is no abdominal tenderness.  Musculoskeletal:        General: No swelling or tenderness.  Lymphadenopathy:     Cervical: No cervical adenopathy.  Skin:    Findings: No erythema or rash.  Neurological:     Mental Status: She is alert and oriented to person, place, and time.  Psychiatric:        Mood and Affect: Mood normal.        Behavior: Behavior normal.     BP 110/70   Pulse 70   Temp 97.8 F (36.6 C)   Resp 16   Ht 5\' 5"  (1.651 m)   Wt 141 lb 12.8 oz (64.3 kg)   SpO2 98%   BMI 23.60 kg/m  Wt Readings from Last 3 Encounters:  11/01/19 141 lb 12.8 oz (64.3 kg)  09/14/18 135 lb 12.8 oz (61.6 kg)  07/14/17 146 lb 12.8 oz (66.6 kg)     Lab Results  Component Value Date   WBC 4.4 10/28/2019   HGB 14.5 10/28/2019   HCT 43.8 10/28/2019   PLT 220.0 10/28/2019   GLUCOSE 101 (H) 10/28/2019   CHOL 204 (H) 10/28/2019   TRIG 51.0 10/28/2019   HDL 68.50 10/28/2019   LDLDIRECT 131.9 10/26/2013   LDLCALC 125 (H) 10/28/2019   ALT 13 10/28/2019   AST 20 10/28/2019   NA 139 10/28/2019   K 4.2 10/28/2019   CL 106 10/28/2019   CREATININE 0.90 10/28/2019   BUN 20 10/28/2019   CO2 26 10/28/2019   TSH 1.31 10/28/2019    Mm 3d Screen Breast Bilateral  Result Date:  07/26/2019 CLINICAL DATA:  Screening. EXAM: DIGITAL SCREENING BILATERAL MAMMOGRAM WITH TOMO AND CAD COMPARISON:  Previous exam(s). ACR Breast Density Category c: The breast tissue is heterogeneously dense, which may  obscure small masses. FINDINGS: There are no findings suspicious for malignancy. Images were processed with CAD. IMPRESSION: No mammographic evidence of malignancy. A result letter of this screening mammogram will be mailed directly to the patient. RECOMMENDATION: Screening mammogram in one year. (Code:SM-B-01Y) BI-RADS CATEGORY  1: Negative. Electronically Signed   By: Lillia Mountain M.D.   On: 07/26/2019 13:22       Assessment & Plan:   Problem List Items Addressed This Visit    Health care maintenance    Physical today 11/01/19.  PAP 09/14/18 - negative with negative HPV.  Was referred to GI.  Evaluated.  They scheduled colonoscopy.  Mammogram 07/26/19 - Birads I.       Hypercholesterolemia    Low cholesterol diet and exercise.  Follow lipid panel.       Relevant Orders   CBC with Differential/Platelet   Comprehensive metabolic panel   TSH   Lipid panel   Melanoma of skin ([place])    Followed by dermatology.        Meningioma [facility])    Was evaluated by Dr Jorene Minors.  Appears to be overdue.  Need to f/u with her - if has planned f/u.            Einar Pheasant, MD

## 2019-11-06 ENCOUNTER — Telehealth: Payer: Self-pay | Admitting: Internal Medicine

## 2019-11-06 ENCOUNTER — Encounter: Payer: Self-pay | Admitting: Internal Medicine

## 2019-11-06 NOTE — Assessment & Plan Note (Addendum)
Was evaluated by Dr Jorene Minors.  Appears to be overdue.  Need to f/u with her - if has planned f/u.

## 2019-11-06 NOTE — Assessment & Plan Note (Signed)
Low cholesterol diet and exercise.  Follow lipid panel.   

## 2019-11-06 NOTE — Assessment & Plan Note (Signed)
Physical today 11/01/19.  PAP 09/14/18 - negative with negative HPV.  Was referred to GI.  Evaluated.  They scheduled colonoscopy.  Mammogram 07/26/19 - Birads I.

## 2019-11-06 NOTE — Telephone Encounter (Signed)
My chart message sent to pt regarding f/u meningioma.

## 2019-11-06 NOTE — Assessment & Plan Note (Signed)
Followed by dermatology

## 2020-08-15 ENCOUNTER — Encounter: Payer: Self-pay | Admitting: Internal Medicine

## 2020-08-29 ENCOUNTER — Encounter: Payer: Self-pay | Admitting: Internal Medicine

## 2020-08-29 ENCOUNTER — Other Ambulatory Visit: Payer: Self-pay

## 2020-08-29 ENCOUNTER — Telehealth (INDEPENDENT_AMBULATORY_CARE_PROVIDER_SITE_OTHER): Payer: BC Managed Care – PPO | Admitting: Internal Medicine

## 2020-08-29 DIAGNOSIS — D329 Benign neoplasm of meninges, unspecified: Secondary | ICD-10-CM

## 2020-08-29 DIAGNOSIS — T148XXA Other injury of unspecified body region, initial encounter: Secondary | ICD-10-CM

## 2020-08-29 DIAGNOSIS — Z7189 Other specified counseling: Secondary | ICD-10-CM | POA: Diagnosis not present

## 2020-08-29 DIAGNOSIS — Z7185 Encounter for immunization safety counseling: Secondary | ICD-10-CM

## 2020-08-29 DIAGNOSIS — E7800 Pure hypercholesterolemia, unspecified: Secondary | ICD-10-CM

## 2020-08-29 NOTE — Progress Notes (Signed)
Patient ID: Carla Horne, female   DOB: July 04, 1956, 64 y.o.   MRN: [id number]   Virtual Visit via video Note  This visit type was conducted due to national recommendations for restrictions regarding the COVID-19 pandemic (e.g. social distancing).  This format is felt to be most appropriate for this patient at this time.  All issues noted in this document were discussed and addressed.  No physical exam was performed (except for noted visual exam findings with Video Visits).   I connected with Dwyane Luo by a video enabled telemedicine application and verified that I am speaking with the correct person using two identifiers. Location patient: home Location provider: work  Persons participating in the virtual visit: patient, provider  The limitations, risks, security and privacy concerns of performing an evaluation and management service by video and the availability of in person appointments have been discussed.  It has also been discussed with the patient that there may be a patient responsible charge related to this service. The patient expressed understanding and agreed to proceed.   Reason for visit: work in appt.   HPI: Work in appt to discuss covid vaccine.  She had questions about the vaccine and about her personal history.  History of meningioma.  Stable.  She reports she is doing relatively well.  Handling stress. No chest pain or sob reported.  Stays active.  No abdominal pain or bowel change reported.  Was at spin class.  Jammed her leg - against machine.  Resulting hematoma.  Persistent.  Is smaller.  Had questions regarding the hematoma - to confirm no contraindication to getting the vaccine.  Questions answered.     ROS: See pertinent positives and negatives per HPI.  Past Medical History:  Diagnosis Date  . Cancer (HCC)    melanoma- leg  . Meningioma [facility])     Past Surgical History:  Procedure Laterality Date  . Bladder Mesh Implant..1990    . BREAST BIOPSY Left 20+ yrs  ago   core - neg  . BREAST SURGERY  1990   breast biopsy  . Remval of mengionoma  2010    Family History  Problem Relation Age of Onset  . Hypercholesterolemia Father   . Hypertension Father   . Breast cancer Paternal Grandmother   . Colon cancer Neg Hx     SOCIAL HX: reviewed.    Current Outpatient Medications:  .  fluticasone (FLONASE) 50 MCG/ACT nasal spray, 2 sprays in each nostril daily x1 week then 1-2 sprays in each nostril daily. (use smallest dose possible for symptom control after week 1)., Disp: , Rfl:   EXAM:  GENERAL: alert, oriented, appears well and in no acute distress  HEENT: atraumatic, conjunttiva clear, no obvious abnormalities on inspection of external nose and ears  NECK: normal movements of the head and neck  LUNGS: on inspection no signs of respiratory distress, breathing rate appears normal, no obvious gross SOB, gasping or wheezing  CV: no obvious cyanosis  PSYCH/NEURO: pleasant and cooperative, no obvious depression or anxiety, speech and thought processing grossly intact  ASSESSMENT AND PLAN:  Discussed the following assessment and plan:  Meningioma Evaluated by Dr Jorene Minors.  Needs to continue f/u with him.    Hypercholesterolemia Follow lipid panel.  Low cholesterol diet and exercise.    Hematoma Large hematoma on leg as outlined.  Is smaller.  Discussed with her today.  No redness or signs of infection.  Will take a while to reabsorb.  Follow.  Notify  me if any change.    Immunization counseling Discussed covid vaccine and questions answered.  Discussed no known contraindication for her to get the vaccine.      I discussed the assessment and treatment plan with the patient. The patient was provided an opportunity to ask questions and all were answered. The patient agreed with the plan and demonstrated an understanding of the instructions.   The patient was advised to call back or seek an in-person evaluation if the symptoms  worsen or if the condition fails to improve as anticipated.   Einar Pheasant, MD

## 2020-09-03 ENCOUNTER — Telehealth: Payer: Self-pay | Admitting: Internal Medicine

## 2020-09-03 ENCOUNTER — Encounter: Payer: Self-pay | Admitting: Internal Medicine

## 2020-09-03 DIAGNOSIS — Z7185 Encounter for immunization safety counseling: Secondary | ICD-10-CM | POA: Insufficient documentation

## 2020-09-03 DIAGNOSIS — T148XXA Other injury of unspecified body region, initial encounter: Secondary | ICD-10-CM | POA: Insufficient documentation

## 2020-09-03 NOTE — Assessment & Plan Note (Signed)
Discussed covid vaccine and questions answered.  Discussed no known contraindication for her to get the vaccine.

## 2020-09-03 NOTE — Assessment & Plan Note (Signed)
Follow lipid panel.  Low cholesterol diet and exercise.

## 2020-09-03 NOTE — Telephone Encounter (Signed)
Please schedule physical in 3 months.  Thanks

## 2020-09-03 NOTE — Assessment & Plan Note (Signed)
Large hematoma on leg as outlined.  Is smaller.  Discussed with her today.  No redness or signs of infection.  Will take a while to reabsorb.  Follow.  Notify me if any change.

## 2020-09-03 NOTE — Assessment & Plan Note (Signed)
Evaluated by Dr Jorene Minors.  Needs to continue f/u with him.

## 2020-09-05 NOTE — Telephone Encounter (Signed)
Scheduled

## 2020-12-05 ENCOUNTER — Ambulatory Visit (INDEPENDENT_AMBULATORY_CARE_PROVIDER_SITE_OTHER): Payer: BC Managed Care – PPO | Admitting: Internal Medicine

## 2020-12-05 ENCOUNTER — Other Ambulatory Visit: Payer: Self-pay

## 2020-12-05 VITALS — BP 112/70 | HR 80 | Temp 98.2°F | Resp 16 | Ht 65.0 in | Wt 142.6 lb

## 2020-12-05 DIAGNOSIS — Z86010 Personal history of colonic polyps: Secondary | ICD-10-CM | POA: Diagnosis not present

## 2020-12-05 DIAGNOSIS — Z0000 Encounter for general adult medical examination without abnormal findings: Secondary | ICD-10-CM | POA: Diagnosis not present

## 2020-12-05 DIAGNOSIS — E7800 Pure hypercholesterolemia, unspecified: Secondary | ICD-10-CM | POA: Diagnosis not present

## 2020-12-05 DIAGNOSIS — Z1231 Encounter for screening mammogram for malignant neoplasm of breast: Secondary | ICD-10-CM

## 2020-12-05 DIAGNOSIS — D329 Benign neoplasm of meninges, unspecified: Secondary | ICD-10-CM

## 2020-12-05 NOTE — Assessment & Plan Note (Signed)
Physical today 12/05/20.  PAP 09/14/18 - negative with negative HPV.  Mammogram 07/26/19 - Birads I.  F/u mammogram ordered.  She will schedule.

## 2020-12-05 NOTE — Progress Notes (Signed)
Patient ID: Carla Horne, female   DOB: 01-Jan-1956, 64 y.o.   MRN: [id number]   Subjective:    Patient ID: Carla Horne, female    DOB: 02-14-1956, 64 y.o.   MRN: [id number]  HPI This visit occurred during the SARS-CoV-2 public health emergency.  Safety protocols were in place, including screening questions prior to the visit, additional usage of staff PPE, and extensive cleaning of exam room while observing appropriate contact time as indicated for disinfecting solutions.  Patient here for her physical exam.  She is doing well.  Feels good.  Staying active. No chest pain or sob.  No acid reflux.  No abdominal pain or bowel change.  She is exercising.  Spin class 3x/week.  Weights 2x/week.  Has known meningioma.  Due f/u with Dr Tommi Rumps.    Past Medical History:  Diagnosis Date  . Cancer (HCC)    melanoma- leg  . Meningioma [facility])    Past Surgical History:  Procedure Laterality Date  . Bladder Mesh Implant..1990    . BREAST BIOPSY Left 20+ yrs ago   core - neg  . BREAST SURGERY  1990   breast biopsy  . Remval of mengionoma  2010   Family History  Problem Relation Age of Onset  . Hypercholesterolemia Father   . Hypertension Father   . Breast cancer Paternal Grandmother   . Colon cancer Neg Hx    Social History   Socioeconomic History  . Marital status: Married    Spouse name: Not on file  . Number of children: 2  . Years of education: Not on file  . Highest education level: Not on file  Occupational History  . Occupation: retired Pharmacist, hospital  Tobacco Use  . Smoking status: Never Smoker  . Smokeless tobacco: Never Used  Substance and Sexual Activity  . Alcohol use: No    Alcohol/week: 0.0 standard drinks  . Drug use: No  . Sexual activity: Not on file    Comment: LMP 2010  Other Topics Concern  . Not on file  Social History Narrative  . Not on file   Social Determinants of Health   Financial Resource Strain: Not on file  Food Insecurity: Not on file  Transportation  Needs: Not on file  Physical Activity: Not on file  Stress: Not on file  Social Connections: Not on file    Outpatient Encounter Medications as of 12/05/2020  Medication Sig  . Ascorbic Acid (VITAMIN C PO) Take by mouth.  . Multiple Vitamins-Minerals (AIRBORNE) CHEW Chew by mouth.  . [DISCONTINUED] fluticasone (FLONASE) 50 MCG/ACT nasal spray 2 sprays in each nostril daily x1 week then 1-2 sprays in each nostril daily. (use smallest dose possible for symptom control after week 1).   No facility-administered encounter medications on file as of 12/05/2020.    Review of Systems  Constitutional: Negative for appetite change and unexpected weight change.  HENT: Negative for congestion, sinus pressure and sore throat.   Eyes: Negative for pain and visual disturbance.  Respiratory: Negative for cough, chest tightness and shortness of breath.   Cardiovascular: Negative for chest pain, palpitations and leg swelling.  Gastrointestinal: Negative for abdominal pain, diarrhea, nausea and vomiting.  Genitourinary: Negative for difficulty urinating and dysuria.  Musculoskeletal: Negative for joint swelling and myalgias.  Skin: Negative for color change and rash.  Neurological: Negative for dizziness, light-headedness and headaches.  Hematological: Negative for adenopathy. Does not bruise/bleed easily.  Psychiatric/Behavioral: Negative for agitation and dysphoric mood.  Objective:    Physical Exam Vitals reviewed.  Constitutional:      General: She is not in acute distress.    Appearance: Normal appearance. She is well-developed and well-nourished.  HENT:     Head: Normocephalic and atraumatic.     Right Ear: External ear normal.     Left Ear: External ear normal.     Mouth/Throat:     Mouth: Oropharynx is clear and moist.  Eyes:     General: No scleral icterus.       Right eye: No discharge.        Left eye: No discharge.     Conjunctiva/sclera: Conjunctivae normal.  Neck:      Thyroid: No thyromegaly.  Cardiovascular:     Rate and Rhythm: Normal rate and regular rhythm.  Pulmonary:     Effort: No tachypnea, accessory muscle usage or respiratory distress.     Breath sounds: Normal breath sounds. No decreased breath sounds or wheezing.  Chest:  Breasts:     Right: No inverted nipple, mass, nipple discharge or tenderness (no axillary adenopathy).     Left: No inverted nipple, mass, nipple discharge or tenderness (no axilarry adenopathy).    Abdominal:     General: Bowel sounds are normal.     Palpations: Abdomen is soft.     Tenderness: There is no abdominal tenderness.  Musculoskeletal:        General: No swelling, tenderness or edema.     Cervical back: Neck supple. No tenderness.  Lymphadenopathy:     Cervical: No cervical adenopathy.  Skin:    Findings: No erythema or rash.  Neurological:     Mental Status: She is alert and oriented to person, place, and time.  Psychiatric:        Mood and Affect: Mood and affect and mood normal.        Behavior: Behavior normal.     BP 112/70   Pulse 80   Temp 98.2 F (36.8 C) (Oral)   Resp 16   Ht 5\' 5"  (1.651 m)   Wt 142 lb 9.6 oz (64.7 kg)   SpO2 98%   BMI 23.73 kg/m  Wt Readings from Last 3 Encounters:  12/05/20 142 lb 9.6 oz (64.7 kg)  08/29/20 140 lb (63.5 kg)  11/01/19 141 lb 12.8 oz (64.3 kg)     Lab Results  Component Value Date   WBC 5.7 12/05/2020   HGB 14.7 12/05/2020   HCT 44.0 12/05/2020   PLT 229.0 12/05/2020   GLUCOSE 76 12/05/2020   CHOL 205 (H) 12/05/2020   TRIG 43.0 12/05/2020   HDL 80.50 12/05/2020   LDLDIRECT 131.9 10/26/2013   LDLCALC 116 (H) 12/05/2020   ALT 17 12/05/2020   AST 23 12/05/2020   NA 137 12/05/2020   K 4.2 12/05/2020   CL 101 12/05/2020   CREATININE 0.95 12/05/2020   BUN 21 12/05/2020   CO2 27 12/05/2020   TSH 0.87 12/05/2020    MM 3D SCREEN BREAST BILATERAL  Result Date: 07/26/2019 CLINICAL DATA:  Screening. EXAM: DIGITAL SCREENING BILATERAL  MAMMOGRAM WITH TOMO AND CAD COMPARISON:  Previous exam(s). ACR Breast Density Category c: The breast tissue is heterogeneously dense, which may obscure small masses. FINDINGS: There are no findings suspicious for malignancy. Images were processed with CAD. IMPRESSION: No mammographic evidence of malignancy. A result letter of this screening mammogram will be mailed directly to the patient. RECOMMENDATION: Screening mammogram in one year. (Code:SM-B-01Y) BI-RADS CATEGORY  1: Negative.  Electronically Signed   By: Lillia Mountain M.D.   On: 07/26/2019 13:22       Assessment & Plan:   Problem List Items Addressed This Visit    Meningioma [facility])    Has been evaluated by Dr Tommi Rumps. Due f/u.  Will call to schedule.  Let me know if problems scheduling.        Hyperbilirubinemia    Previous abdominal ultrasound - negative.  Recheck liver panel.       History of colonic polyps    She has received notice due colonoscopy.  Will call and schedule.        Hypercholesterolemia    Low cholesterol diet and exercise.  Follow lipid panel.       Relevant Orders   CBC with Differential/Platelet (Completed)   Comprehensive metabolic panel (Completed)   TSH (Completed)   Lipid panel (Completed)   Health care maintenance    Physical today 12/05/20.  PAP 09/14/18 - negative with negative HPV.  Mammogram 07/26/19 - [facility].  F/u mammogram ordered.  She will schedule.         Other Visit Diagnoses    Routine general medical examination at a health care facility    -  Primary   Visit for screening mammogram       Relevant Orders   MM 3D SCREEN BREAST BILATERAL       Einar Pheasant, MD

## 2020-12-06 LAB — COMPREHENSIVE METABOLIC PANEL
ALT: 17 U/L (ref 0–35)
AST: 23 U/L (ref 0–37)
Albumin: 4.5 g/dL (ref 3.5–5.2)
Alkaline Phosphatase: 73 U/L (ref 39–117)
BUN: 21 mg/dL (ref 6–23)
CO2: 27 mEq/L (ref 19–32)
Calcium: 9.8 mg/dL (ref 8.4–10.5)
Chloride: 101 mEq/L (ref 96–112)
Creatinine, Ser: 0.95 mg/dL (ref 0.40–1.20)
GFR: 63.77 mL/min (ref >60.00)
Glucose, Bld: 76 mg/dL (ref 70–99)
Potassium: 4.2 mEq/L (ref 3.5–5.1)
Sodium: 137 mEq/L (ref 135–145)
Total Bilirubin: 1.7 mg/dL — ABNORMAL HIGH (ref 0.2–1.2)
Total Protein: 6.8 g/dL (ref 6.0–8.3)

## 2020-12-06 LAB — LIPID PANEL
Cholesterol: 205 mg/dL — ABNORMAL HIGH (ref 0–200)
HDL: 80.5 mg/dL (ref >39.00)
LDL Cholesterol: 116 mg/dL — ABNORMAL HIGH (ref 0–99)
NonHDL: 124.49
Total CHOL/HDL Ratio: 3
Triglycerides: 43 mg/dL (ref 0.0–149.0)
VLDL: 8.6 mg/dL (ref 0.0–40.0)

## 2020-12-06 LAB — CBC WITH DIFFERENTIAL/PLATELET
Basophils Absolute: 0.1 10*3/uL (ref 0.0–0.1)
Basophils Relative: 1.4 % (ref 0.0–3.0)
Eosinophils Absolute: 0.1 10*3/uL (ref 0.0–0.7)
Eosinophils Relative: 1.5 % (ref 0.0–5.0)
HCT: 44 % (ref 36.0–46.0)
Hemoglobin: 14.7 g/dL (ref 12.0–15.0)
Lymphocytes Relative: 31.3 % (ref 12.0–46.0)
Lymphs Abs: 1.8 10*3/uL (ref 0.7–4.0)
MCHC: 33.3 g/dL (ref 30.0–36.0)
MCV: 94.8 fl (ref 78.0–100.0)
Monocytes Absolute: 0.7 10*3/uL (ref 0.1–1.0)
Monocytes Relative: 11.5 % (ref 3.0–12.0)
Neutro Abs: 3.1 10*3/uL (ref 1.4–7.7)
Neutrophils Relative %: 54.3 % (ref 43.0–77.0)
Platelets: 229 10*3/uL (ref 150.0–400.0)
RBC: 4.65 Mil/uL (ref 3.87–5.11)
RDW: 14.3 % (ref 11.5–15.5)
WBC: 5.7 10*3/uL (ref 4.0–10.5)

## 2020-12-06 LAB — TSH: TSH: 0.87 u[IU]/mL (ref 0.35–4.50)

## 2020-12-07 ENCOUNTER — Other Ambulatory Visit: Payer: Self-pay | Admitting: Internal Medicine

## 2020-12-07 NOTE — Progress Notes (Signed)
Order placed for f/u liver panel.  

## 2020-12-12 ENCOUNTER — Encounter: Payer: Self-pay | Admitting: Internal Medicine

## 2020-12-12 NOTE — Assessment & Plan Note (Signed)
Has been evaluated by Dr Zachery Conch. Due f/u.  Will call to schedule.  Let me know if problems scheduling.

## 2020-12-12 NOTE — Assessment & Plan Note (Signed)
Previous abdominal ultrasound - negative.  Recheck liver panel.

## 2020-12-12 NOTE — Assessment & Plan Note (Signed)
She has received notice due colonoscopy.  Will call and schedule.

## 2020-12-12 NOTE — Assessment & Plan Note (Signed)
Low cholesterol diet and exercise.  Follow lipid panel.   

## 2020-12-26 ENCOUNTER — Other Ambulatory Visit: Payer: Self-pay

## 2020-12-27 ENCOUNTER — Encounter: Payer: Self-pay | Admitting: Internal Medicine

## 2020-12-27 ENCOUNTER — Other Ambulatory Visit (INDEPENDENT_AMBULATORY_CARE_PROVIDER_SITE_OTHER): Payer: BC Managed Care – PPO

## 2020-12-27 LAB — HEPATIC FUNCTION PANEL
ALT: 16 U/L (ref 0–35)
AST: 21 U/L (ref 0–37)
Albumin: 4.3 g/dL (ref 3.5–5.2)
Alkaline Phosphatase: 82 U/L (ref 39–117)
Bilirubin, Direct: 0.2 mg/dL (ref 0.0–0.3)
Total Bilirubin: 1.1 mg/dL (ref 0.2–1.2)
Total Protein: 6.5 g/dL (ref 6.0–8.3)

## 2020-12-28 ENCOUNTER — Other Ambulatory Visit: Payer: BC Managed Care – PPO

## 2021-02-01 LAB — HM COLONOSCOPY

## 2021-02-05 ENCOUNTER — Ambulatory Visit
Admission: RE | Admit: 2021-02-05 | Discharge: 2021-02-05 | Disposition: A | Discharge status: Home / Self Care | Payer: BC Managed Care – PPO | Source: Ambulatory Visit | Attending: Internal Medicine | Admitting: Internal Medicine

## 2021-02-05 ENCOUNTER — Other Ambulatory Visit: Payer: Self-pay

## 2021-02-05 DIAGNOSIS — Z1231 Encounter for screening mammogram for malignant neoplasm of breast: Secondary | ICD-10-CM | POA: Insufficient documentation

## 2021-03-31 ENCOUNTER — Other Ambulatory Visit: Payer: Self-pay

## 2021-03-31 ENCOUNTER — Emergency Department: Payer: BC Managed Care – PPO

## 2021-03-31 ENCOUNTER — Emergency Department
Admission: EM | Admit: 2021-03-31 | Discharge: 2021-03-31 | Disposition: A | Discharge status: Home / Self Care | Payer: BC Managed Care – PPO | Attending: Emergency Medicine | Admitting: Emergency Medicine

## 2021-03-31 DIAGNOSIS — Y9301 Activity, walking, marching and hiking: Secondary | ICD-10-CM | POA: Insufficient documentation

## 2021-03-31 DIAGNOSIS — W1839XA Other fall on same level, initial encounter: Secondary | ICD-10-CM | POA: Diagnosis not present

## 2021-03-31 DIAGNOSIS — S52502A Unspecified fracture of the lower end of left radius, initial encounter for closed fracture: Secondary | ICD-10-CM | POA: Insufficient documentation

## 2021-03-31 DIAGNOSIS — S6992XA Unspecified injury of left wrist, hand and finger(s), initial encounter: Secondary | ICD-10-CM | POA: Diagnosis present

## 2021-03-31 DIAGNOSIS — Z85828 Personal history of other malignant neoplasm of skin: Secondary | ICD-10-CM | POA: Insufficient documentation

## 2021-03-31 DIAGNOSIS — S52612A Displaced fracture of left ulna styloid process, initial encounter for closed fracture: Secondary | ICD-10-CM | POA: Insufficient documentation

## 2021-03-31 DIAGNOSIS — Y92007 Garden or yard of unspecified non-institutional (private) residence as the place of occurrence of the external cause: Secondary | ICD-10-CM | POA: Insufficient documentation

## 2021-03-31 MED ORDER — HYDROCODONE-ACETAMINOPHEN 5-325 MG PO TABS
1.0000 | ORAL_TABLET | Freq: Once | ORAL | Status: AC
  Administered 2021-03-31: 1 via ORAL
  Filled 2021-03-31: qty 1

## 2021-03-31 MED ORDER — HYDROCODONE-ACETAMINOPHEN 5-325 MG PO TABS: 1.0000 | ORAL_TABLET | Freq: Four times a day (QID) | ORAL | 0 refills | Status: DC | PRN

## 2021-03-31 MED ORDER — HYDROCODONE-ACETAMINOPHEN 5-325 MG PO TABS
1.0000 | ORAL_TABLET | Freq: Once | ORAL | Status: AC
Start: 2021-03-31 — End: 2021-03-31
  Administered 2021-03-31: 1 via ORAL
  Filled 2021-03-31: qty 1

## 2021-03-31 MED ORDER — ONDANSETRON 4 MG PO TBDP
4.0000 mg | ORAL_TABLET | Freq: Once | ORAL | Status: AC
  Administered 2021-03-31: 4 mg via ORAL
  Filled 2021-03-31: qty 1

## 2021-03-31 NOTE — ED Notes (Signed)
Orthoglass sugartong splint applied to right forearm. Sling applied to right arm. Patient educated on checking capillary refill and monitoring for unrelenting pain and/or swelling. Patient verbalizes understanding.

## 2021-03-31 NOTE — Discharge Instructions (Signed)
Please keep your splint on until follow-up with orthopedics.  Call their office on Monday to schedule an appointment.  Please take 650 mg of Tylenol every 6 hours scheduled.  You may take the additional prescribed Norco as needed every 6 hours in addition to this.  Return to the emergency department for any worsening.

## 2021-03-31 NOTE — ED Notes (Signed)
See triage note. Pt currently cradling her L arm. L radial pulse 1+; hand warm; cap refill less than 3. Obvious deformity/swelling at posterior wrist. Pt reports wrist and forearm pain. Pt can wiggle fingers.

## 2021-03-31 NOTE — ED Provider Notes (Signed)
[facility] Emergency Department Provider Note  ____________________________________________   Event Date/Time   First MD Initiated Contact with Patient 03/31/21 1732     (approximate)  I have reviewed the triage vital signs and the nursing notes.   HISTORY  Chief Complaint Wrist Injury   HPI Carla Horne is a 65 y.o. female who presents to the emergency department for evaluation of left wrist pain.  Patient states that she was walking in her yard and tripped and fell, catching herself on an outstretched left hand.  She had immediate onset of severe pain to the wrist at that time.  She has not tried any alleviating measures prior to arrival.  She denies any history of injury to the left wrist.  She reports she is right-handed.  She denies injuring any other body parts during her fall, denies hitting her head.         Past Medical History:  Diagnosis Date  . Cancer (HCC)    melanoma- leg  . Meningioma [facility])     Patient Active Problem List   Diagnosis Date Noted  . Hematoma 09/03/2020  . Immunization counseling 09/03/2020  . Osteopenia 12/16/2016  . Estrogen deficiency 07/18/2016  . Back pain 07/18/2016  . Health care maintenance 12/14/2015  . Hypercholesterolemia 09/15/2015  . Nocturia 11/06/2014  . Melanoma of skin ([place]) 11/01/2014  . Knee pain, left 08/24/2014  . History of colonic polyps 03/30/2014  . GERD (gastroesophageal reflux disease) 10/30/2013  . Environmental allergies 10/30/2013  . Hyperbilirubinemia 04/18/2013  . Meningioma ([place]) 10/20/2012    Past Surgical History:  Procedure Laterality Date  . Bladder Mesh Implant..1990    . BREAST BIOPSY Left 20+ yrs ago   core - neg  . BREAST SURGERY  1990   breast biopsy  . Remval of mengionoma  2010    Prior to Admission medications   Medication Sig Start Date End Date Taking? Authorizing Provider  HYDROcodone-acetaminophen (NORCO) 5-325 MG tablet Take 1 tablet by mouth every 6  (six) hours as needed for up to 5 days for moderate pain. 03/31/21 04/05/21 Yes Danette Weinfeld, Farrel Gordon, PA  Ascorbic Acid (VITAMIN C PO) Take by mouth.    [provider]  Multiple Vitamins-Minerals (AIRBORNE) CHEW Chew by mouth.    [provider]    Allergies Prednisone  Family History  Problem Relation Age of Onset  . Hypercholesterolemia Father   . Hypertension Father   . Breast cancer Paternal Grandmother   . Colon cancer Neg Hx     Social History Social History   Tobacco Use  . Smoking status: Never Smoker  . Smokeless tobacco: Never Used  Substance Use Topics  . Alcohol use: No    Alcohol/week: 0.0 standard drinks  . Drug use: No    Review of Systems Constitutional: No fever/chills Eyes: No visual changes. ENT: No sore throat. Cardiovascular: Denies chest pain. Respiratory: Denies shortness of breath. Gastrointestinal: No abdominal pain.  No nausea, no vomiting.  No diarrhea.  No constipation. Genitourinary: Negative for dysuria. Musculoskeletal: + Left wrist pain, negative for back pain. Skin: Negative for rash. Neurological: Negative for headaches, focal weakness or numbness.  ____________________________________________   PHYSICAL EXAM:  VITAL SIGNS: ED Triage Vitals  Enc Vitals Group     BP 03/31/21 1729 (!) 151/75     Pulse Rate 03/31/21 1729 67     Resp 03/31/21 1729 18     Temp 03/31/21 1729 98.1 F (36.7 C)  Temp Source 03/31/21 1729 Oral     SpO2 03/31/21 1729 96 %     Weight 03/31/21 1725 142 lb (64.4 kg)     Height 03/31/21 1725 5\' 5"  (1.651 m)     Head Circumference --      Peak Flow --      Pain Score 03/31/21 1725 8     Pain Loc --      Pain Edu? --      Excl. in [place]? --    Constitutional: Alert and oriented. Well appearing and in no acute distress. Eyes: Conjunctivae are normal. PERRL. EOMI. Head: Atraumatic. Nose: No congestion/rhinnorhea. Mouth/Throat: Mucous membranes are moist.  Neck: No stridor.  No  tenderness palpation the midline of cervical spine, no step-off deformities. Cardiovascular: Normal rate, regular rhythm. Grossly normal heart sounds.  Good peripheral circulation. Respiratory: Normal respiratory effort.  No retractions. Lungs CTAB. Gastrointestinal: Soft and nontender. No distention. No abdominal bruits. No CVA tenderness. Musculoskeletal: There is obvious swelling noted to the dorsal side of the left wrist, and patient is closely guarding the left wrist to her body.  Her radial pulses 2+, capillary refill less than 3 seconds all digits.  She is able to initiate active flexion extension of all digits.  Range of motion of the wrist not attempted secondary to suspected injury. Neurologic:  Normal speech and language. No gross focal neurologic deficits are appreciated. No gait instability. Skin:  Skin is warm, dry and intact. No rash noted. Psychiatric: Mood and affect are normal. Speech and behavior are normal.  ____________________________________________  RADIOLOGY I, Marlana Salvage, personally viewed and evaluated these images (plain radiographs) as part of my medical decision making, as well as reviewing the written report by the radiologist.  ED provider interpretation: X-ray of the left wrist demonstrates a mildly displaced distal radius fracture possible involvement of the ulnar styloid process.  Official radiology report(s): DG Wrist Complete Left  Result Date: 03/31/2021 CLINICAL DATA:  Slipped walking down hill trying to catch self EXAM: LEFT WRIST - COMPLETE 3+ VIEW COMPARISON:  None. FINDINGS: There is a mildly displaced transverse fracture of the distal radius. There is subtle lucency in the ulnar styloid process without a definite cortical break. No dislocation. Regional soft tissues are unremarkable. IMPRESSION: 1.  Mildly displaced transverse fracture of the distal radius. 2. Subtle lucency in the ulnar styloid process without a definite cortical break. Difficult  to exclude nondisplaced fracture. Electronically Signed   By: Audie Pinto M.D.   On: 03/31/2021 18:54    ____________________________________________   PROCEDURES  Procedure(s) performed (including Critical Care):  .Ortho Injury Treatment  Date/Time: 04/01/2021 3:50 PM Performed by: Marlana Salvage, PA Authorized by: Marlana Salvage, PA   Consent:    Consent obtained:  Verbal   Consent given by:  Patient   Risks discussed:  Fracture, stiffness and restricted joint movement   Alternatives discussed:  Immobilization, referral and no treatmentInjury location: wrist Location details: left wrist Injury type: fracture Fracture type: distal radius and ulnar styloid Pre-procedure neurovascular assessment: neurovascularly intact Pre-procedure distal perfusion: normal Pre-procedure neurological function: normal Pre-procedure range of motion: reduced  Anesthesia: Local anesthesia used: no  Patient sedated: NoManipulation performed: no Immobilization: splint Splint type: sugar tong and volar short arm Splint Applied by: ED Nurse Supplies used: cotton padding,  Ortho-Glass and elastic bandage Post-procedure neurovascular assessment: post-procedure neurovascularly intact      ____________________________________________   INITIAL IMPRESSION / ASSESSMENT AND PLAN / ED COURSE  As  part of my medical decision making, I reviewed the following data within the [place] notes reviewed and incorporated, Radiograph reviewed, Notes from prior ED visits and [place] Controlled Substance Database        Patient is a 65 year old female who presents to the emergency department for evaluation of left wrist pain after a mechanical fall a few hours ago.  See HPI for further details on physical exam, the patient has noted swelling to the left wrist but remains neurovascularly intact.  X-rays were obtained and demonstrates a mildly displaced left distal radius  fracture with possible ulnar styloid component.  The patient was placed in a volar splint with sugar tong component.  She was given a prescription for Norco given her acute fracture.  The PDMP was reviewed.  Patient was advised to have close follow-up with orthopedics.  She is amenable with this plan.  Return precautions were discussed at length and she stable this time for outpatient follow-up.      ____________________________________________   FINAL CLINICAL IMPRESSION(S) / ED DIAGNOSES  Final diagnoses:  Unspecified fracture of the lower end of left radius, initial encounter for closed fracture     ED Discharge Orders         Ordered    HYDROcodone-acetaminophen (NORCO) 5-325 MG tablet  Every 6 hours PRN        03/31/21 1932          *Please note:  Carla Horne was evaluated in Emergency Department on 04/01/2021 for the symptoms described in the history of present illness. She was evaluated in the context of the global COVID-19 pandemic, which necessitated consideration that the patient might be at risk for infection with the SARS-CoV-2 virus that causes COVID-19. Institutional protocols and algorithms that pertain to the evaluation of patients at risk for COVID-19 are in a state of rapid change based on information released by regulatory bodies including the CDC and federal and state organizations. These policies and algorithms were followed during the patient's care in the ED.  Some ED evaluations and interventions may be delayed as a result of limited staffing during and the pandemic.*   Note:  This document was prepared using Dragon voice recognition software and may include unintentional dictation errors.   Marlana Salvage, PA 04/01/21 1604    Arta Silence, MD 04/07/21 1538

## 2021-03-31 NOTE — ED Triage Notes (Signed)
Patient presents with injury to left wrist

## 2021-04-02 ENCOUNTER — Other Ambulatory Visit: Payer: Self-pay | Admitting: Surgery

## 2021-04-03 ENCOUNTER — Ambulatory Visit
Admission: RE | Admit: 2021-04-03 | Discharge: 2021-04-03 | Disposition: A | Discharge status: Home / Self Care | Payer: BC Managed Care – PPO | Attending: Surgery | Admitting: Surgery

## 2021-04-03 ENCOUNTER — Ambulatory Visit: Payer: BC Managed Care – PPO

## 2021-04-03 ENCOUNTER — Other Ambulatory Visit: Payer: Self-pay

## 2021-04-03 ENCOUNTER — Other Ambulatory Visit: Admission: RE | Admit: 2021-04-03 | Payer: BC Managed Care – PPO | Source: Ambulatory Visit | Admitting: Surgery

## 2021-04-03 ENCOUNTER — Encounter: Payer: Self-pay | Admitting: Surgery

## 2021-04-03 ENCOUNTER — Encounter
Admission: RE | Disposition: A | Discharge status: Home / Self Care | Payer: Self-pay | Source: Home / Self Care | Attending: Surgery

## 2021-04-03 DIAGNOSIS — Y9301 Activity, walking, marching and hiking: Secondary | ICD-10-CM | POA: Insufficient documentation

## 2021-04-03 DIAGNOSIS — S52552A Other extraarticular fracture of lower end of left radius, initial encounter for closed fracture: Secondary | ICD-10-CM | POA: Diagnosis present

## 2021-04-03 DIAGNOSIS — Z79899 Other long term (current) drug therapy: Secondary | ICD-10-CM | POA: Diagnosis not present

## 2021-04-03 DIAGNOSIS — W010XXA Fall on same level from slipping, tripping and stumbling without subsequent striking against object, initial encounter: Secondary | ICD-10-CM | POA: Insufficient documentation

## 2021-04-03 DIAGNOSIS — Y92096 Garden or yard of other non-institutional residence as the place of occurrence of the external cause: Secondary | ICD-10-CM | POA: Diagnosis not present

## 2021-04-03 DIAGNOSIS — Z791 Long term (current) use of non-steroidal anti-inflammatories (NSAID): Secondary | ICD-10-CM | POA: Insufficient documentation

## 2021-04-03 DIAGNOSIS — Z888 Allergy status to other drugs, medicaments and biological substances status: Secondary | ICD-10-CM | POA: Diagnosis not present

## 2021-04-03 HISTORY — PX: ORIF WRIST FRACTURE: SHX2133

## 2021-04-03 SURGERY — OPEN REDUCTION INTERNAL FIXATION (ORIF) WRIST FRACTURE
Anesthesia: General | Site: Wrist | Laterality: Left

## 2021-04-03 MED ORDER — OXYCODONE HCL 5 MG/5ML PO SOLN: 5.0000 mg | Freq: Once | ORAL | Status: AC | PRN

## 2021-04-03 MED ORDER — LIDOCAINE HCL (PF) 2 % IJ SOLN
INTRAMUSCULAR | Status: AC
  Filled 2021-04-03: qty 5

## 2021-04-03 MED ORDER — OXYCODONE HCL 5 MG PO TABS
5.0000 mg | ORAL_TABLET | Freq: Once | ORAL | Status: AC | PRN
  Administered 2021-04-03: 5 mg via ORAL

## 2021-04-03 MED ORDER — ACETAMINOPHEN 10 MG/ML IV SOLN
INTRAVENOUS | Status: AC
  Filled 2021-04-03: qty 100

## 2021-04-03 MED ORDER — DEXAMETHASONE SODIUM PHOSPHATE 10 MG/ML IJ SOLN
INTRAMUSCULAR | Status: AC
  Filled 2021-04-03: qty 1

## 2021-04-03 MED ORDER — PROMETHAZINE HCL 25 MG/ML IJ SOLN: 6.2500 mg | INTRAMUSCULAR | Status: DC | PRN

## 2021-04-03 MED ORDER — ACETAMINOPHEN 10 MG/ML IV SOLN
INTRAVENOUS | Status: DC | PRN
  Administered 2021-04-03: 1000 mg via INTRAVENOUS

## 2021-04-03 MED ORDER — LACTATED RINGERS IV SOLN: INTRAVENOUS | Status: DC

## 2021-04-03 MED ORDER — DEXMEDETOMIDINE (PRECEDEX) IN NS 20 MCG/5ML (4 MCG/ML) IV SYRINGE
PREFILLED_SYRINGE | INTRAVENOUS | Status: AC
  Filled 2021-04-03: qty 5

## 2021-04-03 MED ORDER — OXYCODONE HCL 5 MG PO TABS
ORAL_TABLET | ORAL | Status: AC
  Filled 2021-04-03: qty 1

## 2021-04-03 MED ORDER — MIDAZOLAM HCL 2 MG/2ML IJ SOLN
INTRAMUSCULAR | Status: DC | PRN
  Administered 2021-04-03 (×2): 1 mg via INTRAVENOUS

## 2021-04-03 MED ORDER — FENTANYL CITRATE (PF) 100 MCG/2ML IJ SOLN: 25.0000 ug | INTRAMUSCULAR | Status: DC | PRN

## 2021-04-03 MED ORDER — EPHEDRINE SULFATE 50 MG/ML IJ SOLN
INTRAMUSCULAR | Status: DC | PRN
  Administered 2021-04-03: 10 mg via INTRAVENOUS

## 2021-04-03 MED ORDER — FENTANYL CITRATE (PF) 100 MCG/2ML IJ SOLN
INTRAMUSCULAR | Status: DC | PRN
  Administered 2021-04-03 (×4): 25 ug via INTRAVENOUS

## 2021-04-03 MED ORDER — DEXAMETHASONE SODIUM PHOSPHATE 10 MG/ML IJ SOLN
INTRAMUSCULAR | Status: DC | PRN
  Administered 2021-04-03: 10 mg via INTRAVENOUS

## 2021-04-03 MED ORDER — FAMOTIDINE 20 MG PO TABS
ORAL_TABLET | ORAL | Status: AC
  Filled 2021-04-03: qty 1

## 2021-04-03 MED ORDER — MIDAZOLAM HCL 2 MG/2ML IJ SOLN
INTRAMUSCULAR | Status: AC
  Filled 2021-04-03: qty 2

## 2021-04-03 MED ORDER — FENTANYL CITRATE (PF) 100 MCG/2ML IJ SOLN
INTRAMUSCULAR | Status: AC
  Filled 2021-04-03: qty 2

## 2021-04-03 MED ORDER — ORAL CARE MOUTH RINSE: 15.0000 mL | Freq: Once | OROMUCOSAL | Status: AC

## 2021-04-03 MED ORDER — BUPIVACAINE HCL (PF) 0.5 % IJ SOLN
INTRAMUSCULAR | Status: DC | PRN
  Administered 2021-04-03: 10 mL

## 2021-04-03 MED ORDER — CEFAZOLIN SODIUM-DEXTROSE 2-4 GM/100ML-% IV SOLN
INTRAVENOUS | Status: AC
  Filled 2021-04-03: qty 100

## 2021-04-03 MED ORDER — KETOROLAC TROMETHAMINE 30 MG/ML IJ SOLN
INTRAMUSCULAR | Status: DC | PRN
  Administered 2021-04-03: 15 mg via INTRAVENOUS

## 2021-04-03 MED ORDER — FAMOTIDINE 20 MG PO TABS: 20.0000 mg | ORAL_TABLET | Freq: Once | ORAL | Status: DC

## 2021-04-03 MED ORDER — CHLORHEXIDINE GLUCONATE 0.12 % MT SOLN: 15.0000 mL | Freq: Once | OROMUCOSAL | Status: AC

## 2021-04-03 MED ORDER — PHENYLEPHRINE HCL (PRESSORS) 10 MG/ML IV SOLN
INTRAVENOUS | Status: DC | PRN
  Administered 2021-04-03: 100 ug via INTRAVENOUS

## 2021-04-03 MED ORDER — PROPOFOL 10 MG/ML IV BOLUS
INTRAVENOUS | Status: AC
  Filled 2021-04-03: qty 20

## 2021-04-03 MED ORDER — ONDANSETRON HCL 4 MG/2ML IJ SOLN
INTRAMUSCULAR | Status: AC
  Filled 2021-04-03: qty 2

## 2021-04-03 MED ORDER — HYDROCODONE-ACETAMINOPHEN 5-325 MG PO TABS: 1.0000 | ORAL_TABLET | Freq: Four times a day (QID) | ORAL | 0 refills | Status: AC | PRN

## 2021-04-03 MED ORDER — CHLORHEXIDINE GLUCONATE 0.12 % MT SOLN
OROMUCOSAL | Status: AC
  Administered 2021-04-03: 15 mL via OROMUCOSAL
  Filled 2021-04-03: qty 15

## 2021-04-03 MED ORDER — DEXMEDETOMIDINE HCL 200 MCG/2ML IV SOLN
INTRAVENOUS | Status: DC | PRN
  Administered 2021-04-03: 8 ug via INTRAVENOUS
  Administered 2021-04-03: 12 ug via INTRAVENOUS

## 2021-04-03 MED ORDER — SEVOFLURANE IN SOLN
RESPIRATORY_TRACT | Status: AC
  Filled 2021-04-03: qty 250

## 2021-04-03 MED ORDER — LIDOCAINE HCL (CARDIAC) PF 100 MG/5ML IV SOSY
PREFILLED_SYRINGE | INTRAVENOUS | Status: DC | PRN
  Administered 2021-04-03: 60 mg via INTRAVENOUS

## 2021-04-03 MED ORDER — PROPOFOL 10 MG/ML IV BOLUS
INTRAVENOUS | Status: DC | PRN
  Administered 2021-04-03: 140 mg via INTRAVENOUS

## 2021-04-03 MED ORDER — ONDANSETRON HCL 4 MG/2ML IJ SOLN
INTRAMUSCULAR | Status: DC | PRN
  Administered 2021-04-03: 4 mg via INTRAVENOUS

## 2021-04-03 MED ORDER — CEFAZOLIN SODIUM-DEXTROSE 2-4 GM/100ML-% IV SOLN
2.0000 g | INTRAVENOUS | Status: AC
  Administered 2021-04-03: 2 g via INTRAVENOUS

## 2021-04-03 SURGICAL SUPPLY — 64 items
BIT DRILL 2.2 SS TIBIAL (BIT) ×2 IMPLANT
BNDG COHESIVE 4X5 TAN STRL (GAUZE/BANDAGES/DRESSINGS) IMPLANT
BNDG ELASTIC 3X5.8 VLCR STR LF (GAUZE/BANDAGES/DRESSINGS) ×2 IMPLANT
BNDG ELASTIC 4X5.8 VLCR STR LF (GAUZE/BANDAGES/DRESSINGS) ×2 IMPLANT
BNDG ESMARK 4X12 TAN STRL LF (GAUZE/BANDAGES/DRESSINGS) ×2 IMPLANT
CANISTER SUCT 1200ML W/VALVE (MISCELLANEOUS) ×2 IMPLANT
CHLORAPREP W/TINT 26 (MISCELLANEOUS) ×2 IMPLANT
CORD BIP STRL DISP 12FT (MISCELLANEOUS) ×2 IMPLANT
COVER WAND RF STERILE (DRAPES) ×2 IMPLANT
CUFF TOURN SGL QUICK 18X4 (TOURNIQUET CUFF) ×4 IMPLANT
DRAPE FLUOR MINI C-ARM 54X84 (DRAPES) ×2 IMPLANT
DRAPE ORTHO SPLIT 77X108 STRL (DRAPES) ×1
DRAPE SURG 17X11 SM STRL (DRAPES) ×2 IMPLANT
DRAPE SURG ORHT 6 SPLT 77X108 (DRAPES) ×1 IMPLANT
DRAPE U-SHAPE 47X51 STRL (DRAPES) ×2 IMPLANT
ELECT CAUTERY BLADE 6.4 (BLADE) ×2 IMPLANT
ELECT REM PT RETURN 9FT ADLT (ELECTROSURGICAL) ×2
ELECTRODE REM PT RTRN 9FT ADLT (ELECTROSURGICAL) ×1 IMPLANT
FORCEPS JEWEL BIP 4-3/4 STR (INSTRUMENTS) ×2 IMPLANT
GAUZE SPONGE 4X4 12PLY STRL (GAUZE/BANDAGES/DRESSINGS) ×2 IMPLANT
GAUZE XEROFORM 1X8 LF (GAUZE/BANDAGES/DRESSINGS) ×2 IMPLANT
GLOVE SURG ENC MOIS LTX SZ8 (GLOVE) ×2 IMPLANT
GLOVE SURG UNDER LTX SZ8 (GLOVE) ×2 IMPLANT
GOWN STRL REUS W/ TWL LRG LVL3 (GOWN DISPOSABLE) ×1 IMPLANT
GOWN STRL REUS W/ TWL XL LVL3 (GOWN DISPOSABLE) ×1 IMPLANT
GOWN STRL REUS W/TWL LRG LVL3 (GOWN DISPOSABLE) ×1
GOWN STRL REUS W/TWL XL LVL3 (GOWN DISPOSABLE) ×1
KIT TURNOVER KIT A (KITS) ×2 IMPLANT
MANIFOLD NEPTUNE II (INSTRUMENTS) ×2 IMPLANT
NEEDLE FILTER BLUNT 18X 1/2SAF (NEEDLE) ×1
NEEDLE FILTER BLUNT 18X1 1/2 (NEEDLE) ×1 IMPLANT
NS IRRIG 500ML POUR BTL (IV SOLUTION) ×2 IMPLANT
PACK EXTREMITY ARMC (MISCELLANEOUS) ×2 IMPLANT
PADDING CAST 3IN STRL (MISCELLANEOUS) ×1
PADDING CAST 4IN STRL (MISCELLANEOUS)
PADDING CAST BLEND 3X4 STRL (MISCELLANEOUS) ×1 IMPLANT
PADDING CAST BLEND 4X4 STRL (MISCELLANEOUS) IMPLANT
PEG LOCKING SMOOTH 2.2X12 (Peg) ×2 IMPLANT
PEG LOCKING SMOOTH 2.2X14 (Peg) ×2 IMPLANT
PEG LOCKING SMOOTH 2.2X18 (Peg) ×2 IMPLANT
PEG LOCKING SMOOTH 2.2X20 (Screw) ×2 IMPLANT
PLATE BN MN NAR 41X22 CRSLCK (Plate) ×1 IMPLANT
PLATE CROSSLOCK NAR MINI LT (Plate) ×1 IMPLANT
SCREW 2.7X12MM (Screw) ×2 IMPLANT
SCREW 2.7X14MM (Screw) ×1 IMPLANT
SCREW BN 14X2.7XNONLOCK 3 LD (Screw) ×1 IMPLANT
SCREW LOCK 10X2.7X3 LD THRD (Screw) ×1 IMPLANT
SCREW LOCK 14X2.7X 3 LD TPR (Screw) ×2 IMPLANT
SCREW LOCKING 2.7X10MM (Screw) ×1 IMPLANT
SCREW LOCKING 2.7X14 (Screw) ×2 IMPLANT
SCREW LOCKING 2.7X8MM (Screw) ×2 IMPLANT
SPLINT CAST 1 STEP 3X12 (MISCELLANEOUS) ×2 IMPLANT
SPLINT CAST 1 STEP 4X15 (MISCELLANEOUS) IMPLANT
STAPLER SKIN PROX 35W (STAPLE) ×2 IMPLANT
STOCKINETTE IMPERVIOUS 9X36 MD (GAUZE/BANDAGES/DRESSINGS) ×2 IMPLANT
STRIP CLOSURE SKIN 1/4X4 (GAUZE/BANDAGES/DRESSINGS) IMPLANT
SUT PROLENE 4 0 PS 2 18 (SUTURE) ×2 IMPLANT
SUT VIC AB 2-0 SH 27 (SUTURE) ×1
SUT VIC AB 2-0 SH 27XBRD (SUTURE) ×1 IMPLANT
SUT VIC AB 3-0 SH 27 (SUTURE) ×1
SUT VIC AB 3-0 SH 27X BRD (SUTURE) ×1 IMPLANT
SWABSTK COMLB BENZOIN TINCTURE (MISCELLANEOUS) IMPLANT
SYR 10ML LL (SYRINGE) ×2 IMPLANT
WIRE K 1.6 (WIRE) ×4 IMPLANT

## 2021-04-03 NOTE — Transfer of Care (Signed)
Immediate Anesthesia Transfer of Care Note  Patient: Carla Horne  Procedure(s) Performed: OPEN REDUCTION INTERNAL FIXATION (ORIF) WRIST FRACTURE, left distal radius (Left Wrist)  Patient Location: PACU  Anesthesia Type:General  Level of Consciousness: drowsy  Airway & Oxygen Therapy: Patient Spontanous Breathing and Patient connected to face mask oxygen  Post-op Assessment: Report given to RN and Post -op Vital signs reviewed and stable  Post vital signs: Reviewed and stable  Last Vitals:  Vitals Value Taken Time  BP 136/70 04/03/21 1501  Temp    Pulse 63 04/03/21 1506  Resp 15 04/03/21 1506  SpO2 99 % 04/03/21 1506  Vitals shown include unvalidated device data.  Last Pain:  Vitals:   04/03/21 1116  TempSrc: Temporal  PainSc:          Complications: No complications documented.

## 2021-04-03 NOTE — Anesthesia Procedure Notes (Signed)
Procedure Name: LMA Insertion Date/Time: 04/03/2021 1:25 PM Performed by: Malachi Paradise, RN Pre-anesthesia Checklist: Patient identified, Patient being monitored, Timeout performed, Emergency Drugs available and Suction available Patient Re-evaluated:Patient Re-evaluated prior to induction Oxygen Delivery Method: Circle system utilized Preoxygenation: Pre-oxygenation with 100% oxygen Induction Type: IV induction Ventilation: Mask ventilation without difficulty LMA: LMA inserted LMA Size: 4.0 Tube type: Oral Number of attempts: 1 Placement Confirmation: positive ETCO2 and breath sounds checked- equal and bilateral Tube secured with: Tape Dental Injury: Teeth and Oropharynx as per pre-operative assessment

## 2021-04-03 NOTE — Op Note (Addendum)
04/03/2021  3:06 PM  Patient:   Carla Horne  Pre-Op Diagnosis:   Closed acute extra-articular distal radius fracture, left wrist.  Post-Op Diagnosis:   Same.  Procedure:   Open reduction and internal fixation of displaced extra-articular left distal radius fracture.  Surgeon:   Pascal Lux, MD  Assistant:   Marijean Bravo, PA-S  Anesthesia:   General LMA  Findings:   As above.  Complications:   None  EBL:   2 cc  Fluids:   800 cc crystalloid  TT:   60 minutes at 250 mmHg  Drains:   None  Closure:   3-0 Vicryl subcuticular sutures  Implants:   Biomet DVR Cross-locked narrow precontoured distal radius mini-locking plate.  Brief Clinical Note:   The patient is a 65 year old female who sustained the above-noted injury several days ago when she slipped while descending a hill and fell backwards onto her outstretched left hand. She presented to the emergency room where x-rays demonstrated the above-noted injury. She presents at this time for definitive management of her injury.  Procedure:   The patient was brought into the operating room and lain in the supine position. After adequate general laryngal mask anesthesia was obtained, the patient's left hand and upper extremity were prepped with ChloraPrep solution before being draped sterilely. Preoperative antibiotics were administered. A timeout was performed to verify the appropriate surgical site before the limb was exsanguinated with an Esmarch and the tourniquet inflated to 250 mmHg.   An approximately 7-8 cm incision was made over the volar aspect of the distal radius beginning at the volar flexion crease and extending proximally along the flexor carpi radialis tendon. The incision was carried down through the subcutaneous tissues to expose the superficial retinaculum. This was split the length of the incision directly over the flexor carpi radialis tendon. The FCR sheath was opened and the tendon retracted ulnarly to  protect the median nerve. The floor of the FCR sheath was opened to expose the pronator quadratus muscle. This was released along the radial insertion and the muscle was retracted ulnarly to expose the distal radius. The fracture was identified and soft tissues elevated off the distal metaphyseal region for several centimeters. The appropriate sized plate was selected and positioned on the distal radius. A guidewire was placed through the distal hole and its position verified using FluoroScan imaging in AP and lateral projections. After several attempts, the pin was positioned parallel to the distal articular surface and approximately 3-4 mm proximal to the articular surface. The plate was carefully lowered onto the volar metaphyseal surface, reducing the fracture in the process. Again the position of the plate was verified using FluoroScan imaging in AP and lateral projections and found to be excellent.   The plate was secured using a nonlocking bicortical screw proximally. Distally, a nonlocking cortical screw was placed in the ulnar most hole of the more proximal row. The other holes were filled with locking pegs of the appropriate lengths. The adequacy of hardware position and fracture reduction was verified using FluoroScan imaging in AP, lateral, and several additional oblique projections to be sure that the hardware did not enter the joint nor did it penetrate dorsally. Two locking cortical screws were placed proximally to secure the plate to the metaphyseal region. Again the construct was assessed using FluoroScan imaging in AP, lateral, and oblique projections and found to be excellent.  The wound was copiously irrigated with sterile saline solution before the pronator quadratus was reapproximated using  2-0 Vicryl interrupted sutures. The subcutaneous tissues were closed using 2-0 Vicryl interrupted sutures before the subcuticular layer was closed using 3-0 Vicryl inverted interrupted sutures. Benzoin  and Steri-Strips are applied to the skin. A total of 10 cc of 0.5% plain Sensorcaine was injected in and around the incision site to help with postoperative analgesia before a sterile bulky dressing was applied to the wound. The patient was placed into a volar splint maintaining the wrist in neutral position before the patient was awakened, extubated, and returned to the recovery room in satisfactory condition after tolerating the procedure well.

## 2021-04-03 NOTE — Discharge Instructions (Addendum)
Orthopedic discharge instructions: Keep splint dry and intact. Keep hand elevated above heart level. Apply ice to affected area frequently. Take Mobic 15 mg daily OR ibuprofen 600-800 mg TID with meals for 7-10 days, then as necessary. Take pain medication as prescribed or ES Tylenol when needed.  Return for follow-up in 10-14 days or as scheduled.  AMBULATORY SURGERY  DISCHARGE INSTRUCTIONS   1) The drugs that you were given will stay in your system until tomorrow so for the next 24 hours you should not:  A) Drive an automobile B) Make any legal decisions C) Drink any alcoholic beverage   2) You may resume regular meals tomorrow.  Today it is better to start with liquids and gradually work up to solid foods.  You may eat anything you prefer, but it is better to start with liquids, then soup and crackers, and gradually work up to solid foods.   3) Please notify your doctor immediately if you have any unusual bleeding, trouble breathing, redness and pain at the surgery site, drainage, fever, or pain not relieved by medication.    4) Additional Instructions:   Please contact your physician with any problems or Same Day Surgery at [phone], Monday through Friday 6 am to 4 pm, or [place] at [facility] number at [phone].

## 2021-04-03 NOTE — Anesthesia Preprocedure Evaluation (Addendum)
Anesthesia Evaluation  Patient identified by MRN, date of birth, ID band Patient awake    Reviewed: Allergy & Precautions, H&P , NPO status , Patient's Chart, lab work & pertinent test results  History of Anesthesia Complications Negative for: history of anesthetic complications  Airway Mallampati: II  TM Distance: >3 FB     Dental  (+) Teeth Intact   Pulmonary neg pulmonary ROS, neg sleep apnea, neg COPD,    breath sounds clear to auscultation       Cardiovascular (-) angina(-) Past MI and (-) Cardiac Stents negative cardio ROS  (-) dysrhythmias  Rhythm:regular Rate:Normal     Neuro/Psych negative neurological ROS  negative psych ROS   GI/Hepatic Neg liver ROS, GERD  Controlled,  Endo/Other  negative endocrine ROS  Renal/GU      Musculoskeletal   Abdominal   Peds  Hematology negative hematology ROS (+)   Anesthesia Other Findings Past Medical History: No date: Cancer ([place])     Comment:  melanoma- leg No date: Meningioma [facility])  Past Surgical History: No date: Bladder Mesh Implant..1990 20+ yrs ago: BREAST BIOPSY; Left     Comment:  core - neg 1990: BREAST SURGERY     Comment:  breast biopsy 2010: Remval of mengionoma     Reproductive/Obstetrics negative OB ROS                            Anesthesia Physical Anesthesia Plan  ASA: II  Anesthesia Plan: General   Post-op Pain Management:    Induction:   PONV Risk Score and Plan: Ondansetron, Dexamethasone, Midazolam and Treatment may vary due to age or medical condition  Airway Management Planned:   Additional Equipment:   Intra-op Plan:   Post-operative Plan:   Informed Consent: I have reviewed the patients History and Physical, chart, labs and discussed the procedure including the risks, benefits and alternatives for the proposed anesthesia with the patient or authorized representative who has indicated his/her  understanding and acceptance.     Dental Advisory Given  Plan Discussed with: Anesthesiologist, CRNA and Surgeon  Anesthesia Plan Comments: (Declined nerve block)       Anesthesia Quick Evaluation

## 2021-04-03 NOTE — H&P (Signed)
History of Present Illness: Carla Horne is a 65 y.o. female who presents today for evaluation of a left wrist injury sustained on 04/10/2021. The patient was in her yard walking around when she slipped and fell. The patient fell backwards and instinctively reached out with her left hand to try to catch her self and landed directly onto her left arm. The patient felt a pop and had immediate pain in the left hand and wrist. The patient went to emergency room the same day, at the emergency room x-rays of the left wrist were obtained which demonstrated a mildly displaced transverse fracture of the left distal radius with slight dorsal angulation and impaction. There is a subtle lucency in the ulnar styloid without definitive fracture. The patient was placed in a sugar-tong splint and instructed to follow-up orthopedics. The patient denies any previous injury or trauma to the left wrist. No surgical history for the left wrist. She is right-hand dominant but is extremely active, she works out several times a week and also performs activities on a routine basis and using her left upper extremity. The patient denies any numbness or tingling to left upper extremity at today's appointment. She denies any repeat falls or trauma affecting the left wrist since her initial hospital evaluation. She denies any personal history of heart attack, stroke, asthma or COPD. She does not take any blood thinning medication. The patient was prescribed hydrocodone at the hospital however she states that she is taking Tylenol for discomfort at this time.  Past Medical History: . History of chicken pox  . Meningioma (CMS-HCC)   Past Surgical History: . brain tumor removed  . COLONOSCOPY 07/27/2008 (Comfort Colon Polyps (Father))  . COLONOSCOPY 03/10/2014 (Dr. Kennis Carina @ [facility] - 2 Sessile Serrated Adenomas, FHPolyps(f), CBF 02/2017; Recall Ltr 01/21/2017 (dw))  . COLONOSCOPY 02/01/2021 (Diverticulosis/Otherwise normal colon/PHx CP/Repeat  74yrs/CTL)  . EGD 03/10/2014 (No repeat per RTE)  . TUBAL LIGATION  . wisdom teeth   Past Family History: . Heart disease Mother  . Colon polyps Father  . Stroke Father  . High blood pressure (Hypertension) Father  . No Known Problems Brother   Medications: . amoxicillin (AMOXIL) 500 MG capsule amoxicillin 500 mg capsule TAKE 4 CAPSULES ONE HOUR BEFORE DENTAL APPOINTMENT.  Marland Kitchen ascorbic acid (VITAMIN C ORAL) Take 500 mg by mouth once daily  . HYDROcodone-acetaminophen (NORCO) 5-325 mg tablet 1 tablet every 4 (four) hours as needed  . meloxicam (MOBIC) 15 MG tablet 15 mg once daily  . CALCIUM ORAL Take by mouth   Allergies: . Prednisone Other (Vision changes)   Review of Systems:  A comprehensive 14 point ROS was performed, reviewed by me today, and the pertinent orthopaedic findings are documented in the HPI.  Physical Exam: BP 120/84 (BP Location: Left upper arm, Patient Position: Sitting, BP Cuff Size: Adult)  Ht 165.1 cm (5\' 5" )  Wt 66.4 kg (146 lb 6.4 oz)  BMI 24.36 kg/m  General/Constitutional: The patient appears to be well-nourished, well-developed, and in no acute distress. Neuro/Psych: Normal mood and affect, oriented to person, place and time. Eyes: Non-icteric. Pupils are equal, round, and reactive to light, and exhibit synchronous movement. ENT: Unremarkable. Lymphatic: No palpable adenopathy. Respiratory: Lungs clear to auscultation, Normal chest excursion, No wheezes and Non-labored breathing Cardiovascular: Regular rate and rhythm. No murmurs. and No edema, swelling or tenderness, except as noted in detailed exam. Integumentary: No impressive skin lesions present, except as noted in detailed exam. Musculoskeletal: Unremarkable, except as noted  in detailed exam.  Skin examination of the left upper extremity demonstrates a sugar-tong splint intact to left upper extremity. Mild swelling is noted in the fingers of the left hand, the splint was loosened at today's  appointment. The patient is able to gently flex and extend all digits without discomfort. She is intact light touch on the left upper extremity. Cap refills intact to each individual digit. Splint remained intact following visit.  Imaging: X-rays obtained in the emergency room are detailed in the HPI above.  Impression: 1. Closed Colles' fracture of left radius.  Plan:  1. Treatment options were discussed today with the patient. 2. The x-rays were discussed in detail with the patient today's visit. 3. Discussed both surgical versus nonsurgical treatment options. After discussing the risk and benefits the patient would like to proceed with a open reduction internal fixation of a left distal radius fracture. 4. Surgery will be performed by Dr. Roland Rack on 04/03/2021. This document will serve as a surgical history and physical for the patient. 5. The patient will follow-up in 7-10 days following surgery for splint removal, x-rays of the left wrist and placement into a Velcro wrist splint 6. They can call the clinic they have any questions, new symptoms develop or symptoms worsen.  The procedure was discussed with the patient, as were the potential risks (including bleeding, infection, nerve and/or blood vessel injury, persistent or recurrent pain, failure of the reduction, hardware irritation, hardware removal, progression of arthritis, need for further surgery, blood clots, strokes, heart attacks and/or arhythmias, pneumonia, etc.) and benefits. The patient states her understanding and wishes to proceed.    H&P reviewed and patient re-examined. No changes.

## 2021-04-04 ENCOUNTER — Encounter: Payer: Self-pay | Admitting: Surgery

## 2021-04-05 NOTE — Anesthesia Postprocedure Evaluation (Signed)
Anesthesia Post Note  Patient: Carla Horne  Procedure(s) Performed: OPEN REDUCTION INTERNAL FIXATION (ORIF) WRIST FRACTURE, left distal radius (Left Wrist)  Patient location during evaluation: PACU Anesthesia Type: General Level of consciousness: awake and alert Pain management: pain level controlled Vital Signs Assessment: post-procedure vital signs reviewed and stable Respiratory status: spontaneous breathing, nonlabored ventilation and respiratory function stable Cardiovascular status: blood pressure returned to baseline and stable Postop Assessment: no apparent nausea or vomiting Anesthetic complications: no   No complications documented.   Last Vitals:  Vitals:   04/03/21 1610 04/03/21 1649  BP:  (!) 119/57  Pulse:  62  Resp:  18  Temp: 37.1 C   SpO2:  100%    Last Pain:  Vitals:   04/03/21 1649  TempSrc:   PainSc: [place]

## 2021-05-22 ENCOUNTER — Encounter: Payer: Self-pay | Admitting: Occupational Therapy

## 2021-05-22 ENCOUNTER — Ambulatory Visit: Payer: BC Managed Care – PPO | Attending: Student | Admitting: Occupational Therapy

## 2021-05-22 ENCOUNTER — Other Ambulatory Visit: Payer: Self-pay

## 2021-05-22 DIAGNOSIS — L905 Scar conditions and fibrosis of skin: Secondary | ICD-10-CM | POA: Insufficient documentation

## 2021-05-22 DIAGNOSIS — M25632 Stiffness of left wrist, not elsewhere classified: Secondary | ICD-10-CM | POA: Insufficient documentation

## 2021-05-22 DIAGNOSIS — M6281 Muscle weakness (generalized): Secondary | ICD-10-CM | POA: Insufficient documentation

## 2021-05-22 NOTE — Therapy (Signed)
[facility] [address], [place], [address] Phone: [phone]   Fax:  [phone]  Occupational Therapy Evaluation  Patient Details  Name: Carla Horne MRN: [id number] Date of Birth: [birth year] Referring Provider (OT): DR Roland Rack   Encounter Date: 05/22/2021   OT End of Session - 05/22/21 1759    Visit Number 1    Number of Visits 8    Date for OT Re-Evaluation 06/26/21    OT Start Time 1535    OT Stop Time 1625    OT Time Calculation (min) 50 min    Activity Tolerance Patient tolerated treatment well    Behavior During Therapy [facility] for tasks assessed/performed           Past Medical History:  Diagnosis Date  . Cancer (HCC)    melanoma- leg  . Meningioma [facility])     Past Surgical History:  Procedure Laterality Date  . Bladder Mesh Implant..1990    . BREAST BIOPSY Left 20+ yrs ago   core - neg  . BREAST SURGERY  1990   breast biopsy  . ORIF WRIST FRACTURE Left 04/03/2021   Procedure: OPEN REDUCTION INTERNAL FIXATION (ORIF) WRIST FRACTURE, left distal radius;  Surgeon: Corky Mull, MD;  Location: [facility] ORS;  Service: Orthopedics;  Laterality: Left;  . Remval of mengionoma  2010    There were no vitals filed for this visit.   Subjective Assessment - 05/22/21 1747    Subjective  I am doing okay- mostly out of my splint because it irriates my incision - it stays red and looks swollen , what do you think - I put on Vit E lotion on the scar - it feels tight and stiff my wrist and here over the scar    Pertinent History Carla Horne is a 65 Horne.o. female who had on 04/03/21 a open reduction internal fixation of displaced left extra-articular distal radius fracture by DR Poggi. She is close to 6  wks postsurgery. Overall the patient feels that she is doing well, she does report continued irritation over the volar wrist incision last visit with DR Roland Rack , she does report some swelling and occasional redness  but denies any purulent drainage, fevers or chills.She was put on Keflex.  She denies any significant pain in the wrist at today's visit. The patient denies any numbness or tingling to left upper extremity. She has been coming out of the Velcro wrist splint and has been working on some gentle range of motion activities, she has been nonweightbearing to the left upper extremity at this time. 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 Yes    Pain Score 5     Pain Location Wrist    Pain Orientation Left    Pain Descriptors / Indicators Tightness;Tender    Pain Type Surgical pain    Pain Onset More than a month ago    Pain Frequency Intermittent             [facility] OT Assessment - 05/22/21 0001      Assessment   Medical Diagnosis L distral radius fx with ORIF    Referring Provider (OT) DR Roland Rack  Onset Date/Surgical Date 04/03/21    Hand Dominance Right    Next MD Visit 4 wks      Prior Function   Vocation Part time employment    Leisure Work at [employer]- work out in [employer] - [employer] class and [employer] -      AROM   Right Wrist Extension 70 Degrees    Right Wrist Flexion 85 Degrees    Right Wrist Radial Deviation 38 Degrees    Right Wrist Ulnar Deviation 12 Degrees    Left Wrist Extension 58 Degrees    Left Wrist Flexion 75 Degrees    Left Wrist Radial Deviation 10 Degrees    Left Wrist Ulnar Deviation 32 Degrees      Strength   Right Hand Grip (lbs) 44    Right Hand Lateral Pinch 13 lbs    Right Hand 3 Point Pinch 12 lbs    Left Hand Grip (lbs) 27    Left Hand Lateral Pinch 8 lbs    Left Hand 3 Point Pinch 8 lbs                    OT Treatments/Exercises (OP) - 05/22/21 0001      LUE Fluidotherapy   Number Minutes Fluidotherapy 8 Minutes    LUE Fluidotherapy  Location Hand;Wrist    Comments AROM for wrist in all planes prior to review of HEP           review with pt HEP - to do at home contrast  And only 2-3 x day scar massage - but pt ed on doing at home and not over do or cause friction  cica scar pad for night time with Tubigrip D  Wear as needed during day Benik Neoprene soft wrap   PROM or AAROM over edge of table 10 reps  2-3 x day - keep slight pull or stretch - less than 2/10 1 lbs weight or 16 oz hammer - pain free -12 reps  2 x day        OT Education - 05/22/21 1758    Education Details findins of eval and HEP    Person(s) Educated Patient    Methods Explanation;Demonstration;Tactile cues;Handout    Comprehension Verbal cues required;Returned demonstration;Verbalized understanding            OT Short Term Goals - 05/22/21 1804      OT SHORT TERM GOAL #1   Title Pt to be independent in HEP to do scar management to decrease redness, irritation ,pain and tightness to allow increase wrist ext and flexion    Baseline Ext 58, Flexion 75- scar tight and  with some redness and swelling-   Moist from Vit E lotiion- tender with ROM    Time 2    Period Weeks    Status New    Target Date 06/05/21             OT Long Term Goals - 05/22/21 1806      OT LONG TERM GOAL #1   Title L wrist AROM in all planes increase to [facility] for pt to use 2 lbs weight in all planes with no discomfort or increase symptoms    Baseline L wrist flexion 75, ext 58 - initiated this date 1 lbs - 1 set of 12 - tightenss and tenderness with AAROM of wrist 5/10 - tightness    Time 4    Period Weeks    Status New  Target Date 06/19/21      OT LONG TERM GOAL #2   Title L wrist strength increase for pt to carry or lift more than 5 lbs , push and pull door open    Baseline PROM  and 1 lbs intiated this date - grip 27L ,R 44lbs    Time 5    Period Weeks    Status New    Target Date 06/26/21      OT LONG TERM GOAL #3   Title L grip and  prehension strength increaes with more than 5 and 2 lbs to return to work and working out    Baseline No strengthening yet - PROM and iniitated this date 1 lbs- grip R 40 , L 27, Lat grip 13 R, L 9 , 3 point grip R 12 ,L 8 lbs    Time 5    Period Weeks    Status New    Target Date 06/26/21                 Plan - 05/22/21 1800    Clinical Impression Statement Pt is about 7 wks s/p L distal radius fx wtih ORIF - pt present with decrease AROM for wrist flexion and extention more than RD, UD - pt with volar scar that is adhere and appear some what irritated - pt report not wearing her splint anymore because of irritation and on Keflex since last week - appear pt maybe massaging scar to much and keeping to moist with Vit E - pt ed on scar mobs and fitted with scar pad for night time. Pt with decrease wrist strength and grip/prehension all limiting her functional use of L hand and wrist in ADL's , IADL's and work.    OT Occupational Profile and History Problem Focused Assessment - Including review of records relating to presenting problem    Occupational performance deficits (Please refer to evaluation for details): ADL's;IADL's;Play;Leisure;Work    Marketing executive / Function / Physical Skills ADL;Flexibility;Skin integrity;Scar mobility;ROM;IADL;UE functional use;Strength;Decreased knowledge of precautions;Edema    Rehab Potential Good    Clinical Decision Making Limited treatment options, no task modification necessary    Comorbidities Affecting Occupational Performance: None    Modification or Assistance to Complete Evaluation  No modification of tasks or assist necessary to complete eval    OT Frequency 2x / week    OT Duration --   5 wks   OT Treatment/Interventions Self-care/ADL training;Paraffin;Fluidtherapy;Contrast Bath;Splinting;Passive range of motion;Scar mobilization;Therapeutic exercise    Consulted and Agree with Plan of Care Patient           Patient will benefit from  skilled therapeutic intervention in order to improve the following deficits and impairments:   Body Structure / Function / Physical Skills: ADL,Flexibility,Skin integrity,Scar mobility,ROM,IADL,UE functional use,Strength,Decreased knowledge of precautions,Edema       Visit Diagnosis: Stiffness of left wrist, not elsewhere classified - Plan: Ot plan of care cert/re-cert  Scar condition and fibrosis of skin - Plan: Ot plan of care cert/re-cert  Muscle weakness (generalized) - Plan: Ot plan of care cert/re-cert    Problem List Patient Active Problem List   Diagnosis Date Noted  . Hematoma 09/03/2020  . Immunization counseling 09/03/2020  . Osteopenia 12/16/2016  . Estrogen deficiency 07/18/2016  . Back pain 07/18/2016  . Health care maintenance 12/14/2015  . Hypercholesterolemia 09/15/2015  . Nocturia 11/06/2014  . Melanoma of skin ([place]) 11/01/2014  . Knee pain, left 08/24/2014  . History of colonic polyps  03/30/2014  . GERD (gastroesophageal reflux disease) 10/30/2013  . Environmental allergies 10/30/2013  . Hyperbilirubinemia 04/18/2013  . Meningioma ([place]) 10/20/2012    Carla Horne OTR/L,CLT 05/22/2021, 6:14 PM  [facility] [address] [place], [address] Phone: [phone]   Fax:  [phone]  Name: Carla MURDY MRN: [id number] Date of Birth: 19-Dec-1955

## 2021-05-29 ENCOUNTER — Ambulatory Visit: Payer: BC Managed Care – PPO | Admitting: Occupational Therapy

## 2021-05-29 ENCOUNTER — Other Ambulatory Visit: Payer: Self-pay

## 2021-05-29 DIAGNOSIS — M25632 Stiffness of left wrist, not elsewhere classified: Secondary | ICD-10-CM

## 2021-05-29 DIAGNOSIS — M6281 Muscle weakness (generalized): Secondary | ICD-10-CM

## 2021-05-29 DIAGNOSIS — L905 Scar conditions and fibrosis of skin: Secondary | ICD-10-CM

## 2021-05-29 NOTE — Therapy (Signed)
[facility] [address], [place], [address] Phone: [phone]   Fax:  [phone]  Occupational Therapy Treatment  Patient Details  Name: Carla Horne MRN: [id number] Date of Birth: 1956/01/24 Referring Provider (OT): DR Poggi   Encounter Date: 05/29/2021   OT End of Session - 05/29/21 1616     Visit Number 2    Number of Visits 8    Date for OT Re-Evaluation 06/26/21    OT Start Time 1530    OT Stop Time 1608    OT Time Calculation (min) 38 min    Activity Tolerance Patient tolerated treatment well    Behavior During Therapy [facility] for tasks assessed/performed             Past Medical History:  Diagnosis Date   Cancer ([place])    melanoma- leg   Meningioma ([place])     Past Surgical History:  Procedure Laterality Date   Bladder Mesh Implant..1990     BREAST BIOPSY Left 20+ yrs ago   core - neg   BREAST SURGERY  1990   breast biopsy   ORIF WRIST FRACTURE Left 04/03/2021   Procedure: OPEN REDUCTION INTERNAL FIXATION (ORIF) WRIST FRACTURE, left distal radius;  Surgeon: Corky Mull, MD;  Location: [facility] ORS;  Service: Orthopedics;  Laterality: Left;   Remval of mengionoma  2010    There were no vitals filed for this visit.   Subjective Assessment - 05/29/21 1615     Subjective  I am using it - probably doing more than I should - but I want to think if this is scar tissue - and is it bothering my progress - I don't have any pain - only wearing the soft wrap at work    Pertinent History Carla Horne is a 65 y.o. female who had on 04/03/21 a open reduction internal fixation of displaced left extra-articular distal radius fracture by DR Poggi. She is close to 6  wks postsurgery. Overall the patient feels that she is doing well, she does report continued irritation over the volar wrist incision last visit with DR Roland Rack , she does report some swelling and occasional redness but denies any purulent  drainage, fevers or chills.She was put on Keflex.  She denies any significant pain in the wrist at today's visit. The patient denies any numbness or tingling to left upper extremity. She has been coming out of the Velcro wrist splint and has been working on some gentle range of motion activities, she has been nonweightbearing to the left upper extremity at this time. 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 No/denies                [facility] OT Assessment - 05/29/21 0001       AROM   Left Wrist Extension 65 Degrees    Left Wrist Flexion 82 Degrees    Left Wrist Radial Deviation 12 Degrees    Left Wrist Ulnar Deviation 35 Degrees      Strength   Right Hand Grip (lbs) 44    Right Hand Lateral Pinch 13 lbs    Right Hand 3 Point Pinch 12 lbs    Left  Hand Grip (lbs) 32    Left Hand Lateral Pinch 9.5 lbs    Left Hand 3 Point Pinch 8 lbs             Pt showing great progress in AROM for L wrist and grip strength - did not do any putty yet Scar adhesion improving - pt ed on doing kinesiotape - 30% pull parallel and across - 100 % pull - 2 and 2  Ed on use and allergy - in case    Pt able to pick up and carry - use 2 lbs for shoulder and elbow exercises - felt pull with 3 lbs carrying it        OT Treatments/Exercises (OP) - 05/29/21 0001       LUE Fluidotherapy   Number Minutes Fluidotherapy 8 Minutes    LUE Fluidotherapy Location Hand;Wrist    Comments AROM for wrist in all planes prior to ROM            review with pt HEP for the 2 wks she is going to be out of town  And only 2-3 x day scar massage - but pt ed on doing at home and not over do or cause friction cica scar pad for night time with Tubigrip D Wear as needed during day Benik Neoprene soft wrap   PROM or AAROM  over edge of table 10 reps Add table slides - 2 x day 20 reps And light flexion stretch for wrist over edge of table - 10 reps - 2-3 x day   2-3 x day - keep slight pull or stretch - less than 2/10 1 lbs weight - pain free -12 reps 3 sets -  2 x day  Teal putty - medium add to HEP - 1 sets- 12 - 2 x day  Increase to 2nd and 3rd set over the next 2 wks - pain free          OT Education - 05/29/21 1616     Education Details progress and HEP    Person(s) Educated Patient    Methods Explanation;Demonstration;Tactile cues;Handout    Comprehension Verbal cues required;Returned demonstration;Verbalized understanding              OT Short Term Goals - 05/22/21 1804       OT SHORT TERM GOAL #1   Title Pt to be independent in HEP to do scar management to decrease redness, irritation ,pain and tightness to allow increase wrist ext and flexion    Baseline Ext 58, Flexion 75- scar tight and  with some redness and swelling-   Moist from Vit E lotiion- tender with ROM    Time 2    Period Weeks    Status New    Target Date 06/05/21               OT Long Term Goals - 05/22/21 1806       OT LONG TERM GOAL #1   Title L wrist AROM in all planes increase to [facility] for pt to use 2 lbs weight in all planes with no discomfort or increase symptoms    Baseline L wrist flexion 75, ext 58 - initiated this date 1 lbs - 1 set of 12 - tightenss and tenderness with AAROM of wrist 5/10 - tightness    Time 4    Period Weeks    Status New    Target Date 06/19/21      OT LONG TERM GOAL #  2   Title L wrist strength increase for pt to carry or lift more than 5 lbs , push and pull door open    Baseline PROM  and 1 lbs intiated this date - grip 27L ,R 44lbs    Time 5    Period Weeks    Status New    Target Date 06/26/21      OT LONG TERM GOAL #3   Title L grip and prehension strength increaes with more than 5 and 2 lbs to return to work and working out    Baseline No strengthening yet -  PROM and iniitated this date 1 lbs- grip R 40 , L 27, Lat grip 13 R, L 9 , 3 point grip R 12 ,L 8 lbs    Time 5    Period Weeks    Status New    Target Date 06/26/21                   Plan - 05/29/21 1617     Clinical Impression Statement Pt is about 8 wks s/p L distal radius fx wtih ORIF - pt present at eval with decrease AROM for wrist flexion and extention more than RD, UD - pt with volar scar that is adhere and appear some what irritated - pt report not wearing her splint anymore because of irritation and on Keflex since last week - appear pt maybe massaging scar to much and keeping to moist with Vit E - This date pt show increase AROM in all planes and increase strength - able to add putty to HEP , table slides for wrist extention -and scar moving better- but did provide and ed pt on use of kinesiotape for scar mobs - pt going to be out of town for about 2 wks - provided some HEP and advancements - pt progressing very well -  Pt with decrease wrist strength and grip/prehension all limiting her functional use of L hand and wrist in ADL's , IADL's and work.    OT Occupational Profile and History Problem Focused Assessment - Including review of records relating to presenting problem    Occupational performance deficits (Please refer to evaluation for details): ADL's;IADL's;Play;Leisure;Work    Marketing executive / Function / Physical Skills ADL;Flexibility;Skin integrity;Scar mobility;ROM;IADL;UE functional use;Strength;Decreased knowledge of precautions;Edema    Rehab Potential Good    Clinical Decision Making Limited treatment options, no task modification necessary    Comorbidities Affecting Occupational Performance: None    Modification or Assistance to Complete Evaluation  No modification of tasks or assist necessary to complete eval    OT Frequency 2x / week    OT Duration 4 weeks    OT Treatment/Interventions Self-care/ADL training;Paraffin;Fluidtherapy;Contrast  Bath;Splinting;Passive range of motion;Scar mobilization;Therapeutic exercise    Consulted and Agree with Plan of Care Patient             Patient will benefit from skilled therapeutic intervention in order to improve the following deficits and impairments:   Body Structure / Function / Physical Skills: ADL, Flexibility, Skin integrity, Scar mobility, ROM, IADL, UE functional use, Strength, Decreased knowledge of precautions, Edema       Visit Diagnosis: Stiffness of left wrist, not elsewhere classified  Scar condition and fibrosis of skin  Muscle weakness (generalized)    Problem List Patient Active Problem List   Diagnosis Date Noted   Hematoma 09/03/2020   Immunization counseling 09/03/2020   Osteopenia 12/16/2016   Estrogen deficiency 07/18/2016   Back  pain 07/18/2016   Health care maintenance 12/14/2015   Hypercholesterolemia 09/15/2015   Nocturia 11/06/2014   Melanoma of skin ([place]) 11/01/2014   Knee pain, left 08/24/2014   History of colonic polyps 03/30/2014   GERD (gastroesophageal reflux disease) 10/30/2013   Environmental allergies 10/30/2013   Hyperbilirubinemia 04/18/2013   Meningioma ([place]) 10/20/2012    Rosalyn Gess OTR/L,CLT 05/29/2021, 4:19 PM  [facility] [address], [place], [address] Phone: [phone]   Fax:  [phone]  Name: Carla Horne MRN: [id number] Date of Birth: [birth year]

## 2021-06-13 ENCOUNTER — Other Ambulatory Visit: Payer: Self-pay

## 2021-06-13 ENCOUNTER — Ambulatory Visit: Payer: BC Managed Care – PPO | Admitting: Occupational Therapy

## 2021-06-13 DIAGNOSIS — L905 Scar conditions and fibrosis of skin: Secondary | ICD-10-CM

## 2021-06-13 DIAGNOSIS — M25632 Stiffness of left wrist, not elsewhere classified: Secondary | ICD-10-CM

## 2021-06-13 DIAGNOSIS — M6281 Muscle weakness (generalized): Secondary | ICD-10-CM

## 2021-06-13 NOTE — Therapy (Signed)
Carla Horne [address], [place], [address] Phone: [phone]   Fax:  [phone]  Occupational Therapy Treatment/discharge  Patient Details  Name: Carla Horne MRN: [id number] Date of Birth: [birth year] Referring Provider (OT): DR Poggi   Encounter Date: 06/13/2021   OT End of Session - 06/13/21 1821     Visit Number 3    Number of Visits 8    Date for OT Re-Evaluation 06/26/21    OT Start Time 1530    OT Stop Time 1600    OT Time Calculation (min) 30 min    Activity Tolerance Patient tolerated treatment well    Behavior During Therapy Carla Horne for tasks assessed/performed             Past Medical History:  Diagnosis Date   Cancer ([place])    melanoma- leg   Meningioma ([place])     Past Surgical History:  Procedure Laterality Date   Bladder Mesh Implant..1990     BREAST BIOPSY Left 20+ yrs ago   core - neg   BREAST SURGERY  1990   breast biopsy   ORIF WRIST FRACTURE Left 04/03/2021   Procedure: OPEN REDUCTION INTERNAL FIXATION (ORIF) WRIST FRACTURE, left distal radius;  Surgeon: Corky Mull, MD;  Location: [facility] ORS;  Service: Orthopedics;  Laterality: Left;   Remval of mengionoma  2010    There were no vitals filed for this visit.   Subjective Assessment - 06/13/21 1819     Subjective  I am doing very well - was in [facility] - only wore my soft splint in airport - but using it - going back to workout tomorrow- just don't know if I can take weight    Pertinent History Carla Horne is a 65 y.o. female who had on 04/03/21 a open reduction internal fixation of displaced left extra-articular distal radius fracture by DR Poggi. She is close to 6  wks postsurgery. Overall the patient feels that she is doing well, she does report continued irritation over the volar wrist incision last visit with DR Roland Rack , she does report some swelling and occasional redness but denies any purulent drainage, fevers or  chills.She was put on Keflex.  She denies any significant pain in the wrist at today's visit. The patient denies any numbness or tingling to left upper extremity. She has been coming out of the Velcro wrist splint and has been working on some gentle range of motion activities, she has been nonweightbearing to the left upper extremity at this time. Pt refer to OT/hand therapy    Patient Stated Goals I want my incision and wrist to feel better and move so I can do my work to carry and pick up trays and drinks, work out at [employer] , I do [employer] and spinnig class                [facility] OT Assessment - 06/13/21 0001       AROM   Left Wrist Extension 70 Degrees    Left Wrist Flexion 90 Degrees    Left Wrist Radial Deviation 20 Degrees    Left Wrist Ulnar Deviation 32 Degrees      Strength   Right Hand Grip (lbs) 45    Right Hand Lateral Pinch 13 lbs    Right Hand 3 Point Pinch 12 lbs    Left Hand Grip (lbs) 42    Left Hand Lateral Pinch 9 lbs  Left Hand 3 Point Pinch 11 lbs               Pt made great progress in AROM for wrist  Wrist strength in all planes 5/5  And grip and prehension increase greatly  She can push, pull  heavy door- push up from chair and do wall pushups Pt start back to workout at [facility] tomorrow  Can lift and carry 10 lbs in gym today without pain  Pt to gradually increase her weight with workout - 3, 5 and then 8 lbs  Pain free   Discharge at this time              OT Education - 06/13/21 1821     Education Details progress and discharge    Person(s) Educated Patient    Methods Explanation;Demonstration;Tactile cues;Handout    Comprehension Verbal cues required;Returned demonstration;Verbalized understanding              OT Short Term Goals - 06/13/21 1825       OT SHORT TERM GOAL #1   Title Pt to be independent in HEP to do scar management to decrease redness, irritation ,pain and tightness to allow increase wrist ext and flexion     Status Achieved               OT Long Term Goals - 06/13/21 1826       OT LONG TERM GOAL #1   Title L wrist AROM in all planes increase to [facility] for pt to use 2 lbs weight in all planes with no discomfort or increase symptoms    Status Achieved      OT LONG TERM GOAL #2   Title L wrist strength increase for pt to carry or lift more than 5 lbs , push and pull door open    Status Achieved      OT LONG TERM GOAL #3   Title L grip and prehension strength increaes with more than 5 and 2 lbs to return to work and working out    Status Achieved                   Plan - 06/13/21 1822     Clinical Impression Statement Pt is about 10 wks s/p L distal radius fx wtih ORIF - pt made great progress in AROM of L wrist , scar adhesions improved and grip and prehension increase greatly - pt using hand in work and able to push and pull heavy door, carry and lift about 10 lbs with no pain or pull - push up from chair and do wall pushups- pt to cont to do some table slides and wall push ups- but not floor pushups yet- can get to gym and increase weight gradually in UB - 3 lbs then 5 and then back to her 8 but slowly increase and symptoms free - pt discharge at this time and met all goals..    OT Occupational Profile and History Problem Focused Assessment - Including review of records relating to presenting problem    Occupational performance deficits (Please refer to evaluation for details): ADL's;IADL's;Play;Leisure;Work    Marketing executive / Function / Physical Skills ADL;Flexibility;Skin integrity;Scar mobility;ROM;IADL;UE functional use;Strength;Decreased knowledge of precautions;Edema    Rehab Potential Good    Clinical Decision Making Limited treatment options, no task modification necessary    Comorbidities Affecting Occupational Performance: None    Modification or Assistance to Complete Evaluation  No modification of tasks or assist  necessary to complete eval    OT  Treatment/Interventions Self-[place]orne/ADL training;Paraffin;Fluidtherapy;Contrast Bath;Splinting;Passive range of motion;Scar mobilization;Therapeutic exercise    Consulted and Agree with Plan of [place] Patient             Patient will benefit from skilled therapeutic intervention in order to improve the following deficits and impairments:   Body Structure / Function / Physical Skills: ADL, Flexibility, Skin integrity, Scar mobility, ROM, IADL, UE functional use, Strength, Decreased knowledge of precautions, Edema       Visit Diagnosis: Scar condition and fibrosis of skin  Muscle weakness (generalized)  Stiffness of left wrist, not elsewhere classified    Problem List Patient Active Problem List   Diagnosis Date Noted   Hematoma 09/03/2020   Immunization counseling 09/03/2020   Osteopenia 12/16/2016   Estrogen deficiency 07/18/2016   Back pain 07/18/2016   Health [place] maintenance 12/14/2015   Hypercholesterolemia 09/15/2015   Nocturia 11/06/2014   Melanoma of skin ([place]) 11/01/2014   Knee pain, left 08/24/2014   History of colonic polyps 03/30/2014   GERD (gastroesophageal reflux disease) 10/30/2013   Environmental allergies 10/30/2013   Hyperbilirubinemia 04/18/2013   Meningioma ([place]) 10/20/2012    Makaria Poarch OTR/L,CLT 06/13/2021, 6:27 PM  [place] [facility] [address] [place], [address] Phone: [phone]   Fax:  [phone]  Name: RODNISHA BLOMGREN MRN: [id number] Date of Birth: 07/20/1956

## 2021-11-21 ENCOUNTER — Other Ambulatory Visit: Payer: Self-pay | Admitting: Neurosurgery

## 2021-11-21 DIAGNOSIS — D329 Benign neoplasm of meninges, unspecified: Secondary | ICD-10-CM

## 2021-12-04 ENCOUNTER — Ambulatory Visit
Admission: RE | Admit: 2021-12-04 | Discharge: 2021-12-04 | Disposition: A | Discharge status: Home / Self Care | Payer: BC Managed Care – PPO | Source: Ambulatory Visit | Attending: Neurosurgery | Admitting: Neurosurgery

## 2021-12-04 ENCOUNTER — Other Ambulatory Visit: Payer: Self-pay

## 2021-12-04 DIAGNOSIS — D329 Benign neoplasm of meninges, unspecified: Secondary | ICD-10-CM | POA: Diagnosis not present

## 2021-12-04 MED ORDER — GADOBUTROL 1 MMOL/ML IV SOLN
6.0000 mL | Freq: Once | INTRAVENOUS | Status: AC | PRN
  Administered 2021-12-04: 10:00:00 6 mL via INTRAVENOUS

## 2021-12-31 ENCOUNTER — Encounter: Payer: BC Managed Care – PPO | Admitting: Internal Medicine

## 2022-03-07 ENCOUNTER — Other Ambulatory Visit (HOSPITAL_COMMUNITY)
Admission: RE | Admit: 2022-03-07 | Discharge: 2022-03-07 | Disposition: A | Discharge status: Home / Self Care | Payer: BC Managed Care – PPO | Source: Ambulatory Visit | Attending: Internal Medicine | Admitting: Internal Medicine

## 2022-03-07 ENCOUNTER — Encounter: Payer: Self-pay | Admitting: Internal Medicine

## 2022-03-07 ENCOUNTER — Other Ambulatory Visit: Payer: Self-pay

## 2022-03-07 ENCOUNTER — Ambulatory Visit (INDEPENDENT_AMBULATORY_CARE_PROVIDER_SITE_OTHER): Payer: BC Managed Care – PPO | Admitting: Internal Medicine

## 2022-03-07 VITALS — BP 118/70 | HR 75 | Temp 98.0°F | Ht 65.0 in | Wt 153.0 lb

## 2022-03-07 DIAGNOSIS — Z1231 Encounter for screening mammogram for malignant neoplasm of breast: Secondary | ICD-10-CM | POA: Diagnosis not present

## 2022-03-07 DIAGNOSIS — E7800 Pure hypercholesterolemia, unspecified: Secondary | ICD-10-CM

## 2022-03-07 DIAGNOSIS — R42 Dizziness and giddiness: Secondary | ICD-10-CM

## 2022-03-07 DIAGNOSIS — T148XXA Other injury of unspecified body region, initial encounter: Secondary | ICD-10-CM

## 2022-03-07 DIAGNOSIS — Z86010 Personal history of colonic polyps: Secondary | ICD-10-CM

## 2022-03-07 DIAGNOSIS — Z124 Encounter for screening for malignant neoplasm of cervix: Secondary | ICD-10-CM | POA: Diagnosis present

## 2022-03-07 DIAGNOSIS — Z0000 Encounter for general adult medical examination without abnormal findings: Secondary | ICD-10-CM

## 2022-03-07 DIAGNOSIS — D329 Benign neoplasm of meninges, unspecified: Secondary | ICD-10-CM

## 2022-03-07 LAB — COMPREHENSIVE METABOLIC PANEL
ALT: 15 U/L (ref 0–35)
AST: 21 U/L (ref 0–37)
Albumin: 4.6 g/dL (ref 3.5–5.2)
Alkaline Phosphatase: 70 U/L (ref 39–117)
BUN: 14 mg/dL (ref 6–23)
CO2: 28 mEq/L (ref 19–32)
Calcium: 9.6 mg/dL (ref 8.4–10.5)
Chloride: 103 mEq/L (ref 96–112)
Creatinine, Ser: 0.89 mg/dL (ref 0.40–1.20)
GFR: 68.36 mL/min (ref >60.00)
Glucose, Bld: 85 mg/dL (ref 70–99)
Potassium: 4 mEq/L (ref 3.5–5.1)
Sodium: 139 mEq/L (ref 135–145)
Total Bilirubin: 1 mg/dL (ref 0.2–1.2)
Total Protein: 7 g/dL (ref 6.0–8.3)

## 2022-03-07 LAB — CBC WITH DIFFERENTIAL/PLATELET
Basophils Absolute: 0 10*3/uL (ref 0.0–0.1)
Basophils Relative: 0.8 % (ref 0.0–3.0)
Eosinophils Absolute: 0.2 10*3/uL (ref 0.0–0.7)
Eosinophils Relative: 3.3 % (ref 0.0–5.0)
HCT: 46.2 % — ABNORMAL HIGH (ref 36.0–46.0)
Hemoglobin: 15.2 g/dL — ABNORMAL HIGH (ref 12.0–15.0)
Lymphocytes Relative: 21.1 % (ref 12.0–46.0)
Lymphs Abs: 1.3 10*3/uL (ref 0.7–4.0)
MCHC: 32.9 g/dL (ref 30.0–36.0)
MCV: 96.4 fl (ref 78.0–100.0)
Monocytes Absolute: 0.6 10*3/uL (ref 0.1–1.0)
Monocytes Relative: 10 % (ref 3.0–12.0)
Neutro Abs: 3.9 10*3/uL (ref 1.4–7.7)
Neutrophils Relative %: 64.8 % (ref 43.0–77.0)
Platelets: 218 10*3/uL (ref 150.0–400.0)
RBC: 4.79 Mil/uL (ref 3.87–5.11)
RDW: 13.7 % (ref 11.5–15.5)
WBC: 6 10*3/uL (ref 4.0–10.5)

## 2022-03-07 LAB — TSH: TSH: 1.1 u[IU]/mL (ref 0.35–5.50)

## 2022-03-07 LAB — LIPID PANEL
Cholesterol: 230 mg/dL — ABNORMAL HIGH (ref 0–200)
HDL: 79.2 mg/dL (ref >39.00)
LDL Cholesterol: 137 mg/dL — ABNORMAL HIGH (ref 0–99)
NonHDL: 150.44
Total CHOL/HDL Ratio: 3
Triglycerides: 66 mg/dL (ref 0.0–149.0)
VLDL: 13.2 mg/dL (ref 0.0–40.0)

## 2022-03-07 NOTE — Assessment & Plan Note (Addendum)
Physical today 03/07/22.  PAP 03/07/22.  Mammogram 02/08/21 - Birads I.  Colonoscopy 01/2021.  Need results.  ?

## 2022-03-07 NOTE — Patient Instructions (Signed)
Debrox - ear wax softener

## 2022-03-07 NOTE — Progress Notes (Signed)
Patient ID: Carla Horne, female   DOB: 1956/03/17, 66 y.o.   MRN: [id number] ? ? ?Subjective:  ? ? Patient ID: Carla Horne, female    DOB: 19-May-1956, 66 y.o.   MRN: [id number] ? ?This visit occurred during the SARS-CoV-2 public health emergency.  Safety protocols were in place, including screening questions prior to the visit, additional usage of staff PPE, and extensive cleaning of exam room while observing appropriate contact time as indicated for disinfecting solutions.  ? ?Patient here for her physical exam.  .  ? ?HPI ?S/p removal of parietal tumor.  Followed by Dr Tommi Rumps.  Evaluated 12/11/21.  Felt stable.  Recommended f/u in 2 years.  She does report one episode of dizziness one year ago.  Was evaluated at [facility].  No acute issues identified.  She irrigated her ear and her symptoms resolved.  She had another episode two weeks ago.  Felt similar so she irrigated her ears and resolved.  No other episodes of light headedness or dizziness.  No headache.  No sinus congestion or ear pain.  No sore throat, chest congestion or cough reported.  No abdominal pain or bowel change reported.  Increased stress. Mother had a stroke.  A tree fell on her husband's leg.  Was in hospital for 7 weeks.  He is doing some better, but still increased issues.  Also work is stressful.  Discussed.  She does not feel needs any further intervention.  Also, persistent knot - left thigh.  No pain.  Had large hematoma.  Has been present for two years.  Concerned regarding persistence.   ? ? ?Past Medical History:  ?Diagnosis Date  ? Cancer [facility])   ? melanoma- leg  ? Meningioma ([place])   ? ?Past Surgical History:  ?Procedure Laterality Date  ? Bladder Mesh Implant..1990    ? BREAST BIOPSY Left 20+ yrs ago  ? core - neg  ? BREAST SURGERY  1990  ? breast biopsy  ? ORIF WRIST FRACTURE Left 04/03/2021  ? Procedure: OPEN REDUCTION INTERNAL FIXATION (ORIF) WRIST FRACTURE, left distal radius;  Surgeon: Corky Mull, MD;  Location: [facility] ORS;   Service: Orthopedics;  Laterality: Left;  ? Remval of mengionoma  2010  ? ?Family History  ?Problem Relation Age of Onset  ? Hypercholesterolemia Father   ? Hypertension Father   ? Breast cancer Paternal Grandmother   ? Colon cancer Neg Hx   ? ?Social History  ? ?Socioeconomic History  ? Marital status: Married  ?  Spouse name: Not on file  ? Number of children: 2  ? Years of education: Not on file  ? Highest education level: Not on file  ?Occupational History  ? Occupation: retired Pharmacist, hospital  ?Tobacco Use  ? Smoking status: Never  ? Smokeless tobacco: Never  ?Substance and Sexual Activity  ? Alcohol use: No  ?  Alcohol/week: 0.0 standard drinks  ? Drug use: No  ? Sexual activity: Not on file  ?  Comment: LMP 2010  ?Other Topics Concern  ? Not on file  ?Social History Narrative  ? Not on file  ? ?Social Determinants of Health  ? ?Financial Resource Strain: Not on file  ?Food Insecurity: Not on file  ?Transportation Needs: Not on file  ?Physical Activity: Not on file  ?Stress: Not on file  ?Social Connections: Not on file  ? ? ? ?Review of Systems  ?Constitutional:  Negative for appetite change and unexpected weight change.  ?HENT:  Negative for  congestion and sinus pressure.   ?Respiratory:  Negative for cough, chest tightness and shortness of breath.   ?Cardiovascular:  Negative for chest pain, palpitations and leg swelling.  ?Gastrointestinal:  Negative for abdominal pain, diarrhea, nausea and vomiting.  ?Genitourinary:  Negative for difficulty urinating and dysuria.  ?Musculoskeletal:  Negative for joint swelling and myalgias.  ?Skin:  Negative for color change and rash.  ?Neurological:  Negative for dizziness, light-headedness and headaches.  ?Psychiatric/Behavioral:  Negative for agitation and dysphoric mood.   ? ?   ?Objective:  ?  ? ?BP 118/70 (BP Location: Left Arm, Patient Position: Sitting, Cuff Size: Small)   Pulse 75   Temp 98 ?F (36.7 ?C) (Oral)   Ht '5\' 5"'$  (1.651 m)   Wt 153 lb (69.4 kg)   SpO2 97%    BMI 25.46 kg/m?  ?Wt Readings from Last 3 Encounters:  ?03/07/22 153 lb (69.4 kg)  ?03/31/21 142 lb (64.4 kg)  ?12/05/20 142 lb 9.6 oz (64.7 kg)  ? ? ?Physical Exam ?Vitals reviewed.  ?Constitutional:   ?   General: She is not in acute distress. ?   Appearance: Normal appearance. She is well-developed.  ?HENT:  ?   Head: Normocephalic and atraumatic.  ?   Right Ear: External ear normal.  ?   Left Ear: External ear normal.  ?Eyes:  ?   General: No scleral icterus.    ?   Right eye: No discharge.     ?   Left eye: No discharge.  ?   Conjunctiva/sclera: Conjunctivae normal.  ?Neck:  ?   Thyroid: No thyromegaly.  ?Cardiovascular:  ?   Rate and Rhythm: Normal rate and regular rhythm.  ?Pulmonary:  ?   Effort: No tachypnea, accessory muscle usage or respiratory distress.  ?   Breath sounds: Normal breath sounds. No decreased breath sounds or wheezing.  ?Chest:  ?Breasts: ?   Right: No inverted nipple, mass, nipple discharge or tenderness (no axillary adenopathy).  ?   Left: No inverted nipple, mass, nipple discharge or tenderness (no axilarry adenopathy).  ?Abdominal:  ?   General: Bowel sounds are normal.  ?   Palpations: Abdomen is soft.  ?   Tenderness: There is no abdominal tenderness.  ?Genitourinary: ?   Comments: Normal external genitalia.  Vaginal vault without lesions.  Cervix identified.  Pap smear performed.  Could not appreciate any adnexal masses or tenderness.   ?Musculoskeletal:     ?   General: No swelling or tenderness.  ?   Cervical back: Neck supple. No tenderness.  ?Lymphadenopathy:  ?   Cervical: No cervical adenopathy.  ?Skin: ?   Findings: No erythema or rash.  ?   Comments: Palpable soft tissue mass - right upper leg.  No pain.    ?Neurological:  ?   Mental Status: She is alert and oriented to person, place, and time.  ?Psychiatric:     ?   Mood and Affect: Mood normal.     ?   Behavior: Behavior normal.  ? ? ? ?Outpatient Encounter Medications as of 03/07/2022  ?Medication Sig  ? [DISCONTINUED]  acetaminophen (TYLENOL) 500 MG tablet Take 1,000 mg by mouth in the morning and at bedtime. (Patient not taking: Reported on 03/07/2022)  ? [DISCONTINUED] calcium carbonate (OS-CAL - DOSED IN MG OF ELEMENTAL CALCIUM) 1250 (500 Ca) MG tablet Take 500 mg by mouth daily with breakfast. (Patient not taking: Reported on 03/07/2022)  ? [DISCONTINUED] meloxicam (MOBIC) 15 MG tablet Take 15  mg by mouth daily as needed for pain. (Patient not taking: Reported on 03/07/2022)  ? ?No facility-administered encounter medications on file as of 03/07/2022.  ?  ? ?Lab Results  ?Component Value Date  ? WBC 6.0 03/07/2022  ? HGB 15.2 (H) 03/07/2022  ? HCT 46.2 (H) 03/07/2022  ? PLT 218.0 03/07/2022  ? GLUCOSE 85 03/07/2022  ? CHOL 230 (H) 03/07/2022  ? TRIG 66.0 03/07/2022  ? HDL 79.20 03/07/2022  ? LDLDIRECT 131.9 10/26/2013  ? LDLCALC 137 (H) 03/07/2022  ? ALT 15 03/07/2022  ? AST 21 03/07/2022  ? NA 139 03/07/2022  ? K 4.0 03/07/2022  ? CL 103 03/07/2022  ? CREATININE 0.89 03/07/2022  ? BUN 14 03/07/2022  ? CO2 28 03/07/2022  ? TSH 1.10 03/07/2022  ? ? ?MR BRAIN W WO CONTRAST ? ?Result Date: 12/04/2021 ?CLINICAL DATA:  Meningioma ([place]) D32.9 (ICD-10-CM) Hx of meningioma resection in 2011-scan today for follow up-no symptoms EXAM: MRI HEAD WITHOUT AND WITH CONTRAST TECHNIQUE: Multiplanar, multiecho pulse sequences of the brain and surrounding structures were obtained without and with intravenous contrast. CONTRAST:  24m GADAVIST GADOBUTROL 1 MMOL/ML IV SOLN COMPARISON:  10/28/13 FINDINGS: Brain: Anterior midline para falcine meningioma measures 2.3 x 3.1 x 2.0 cm. Previous measurement was 2.6 x 2.2 x 2.0 cm. There is increased heterogeneity of contrast enhancement on the current study. Small amount of blood products within the meningioma. No acute hemorrhage. There is multifocal hyperintense T2-weighted signal within the white matter. Generalized volume loss without a clear lobar predilection. No abnormal signal within the brain  parenchyma directly abutting the meningioma. The midline structures are normal. Vascular: Major flow voids are preserved. Skull and upper cervical spine: Remote superior craniotomy. Sinuses/Orbits:No paranasal sinus

## 2022-03-08 ENCOUNTER — Other Ambulatory Visit: Payer: Self-pay | Admitting: Internal Medicine

## 2022-03-08 ENCOUNTER — Encounter: Payer: Self-pay | Admitting: Internal Medicine

## 2022-03-08 DIAGNOSIS — E7800 Pure hypercholesterolemia, unspecified: Secondary | ICD-10-CM

## 2022-03-08 DIAGNOSIS — R42 Dizziness and giddiness: Secondary | ICD-10-CM | POA: Insufficient documentation

## 2022-03-08 NOTE — Progress Notes (Signed)
Order placed for f/u labs.  

## 2022-03-08 NOTE — Assessment & Plan Note (Signed)
S/p removal of parietal tumor.  Followed by Dr Friedman.  Evaluated 12/11/21.  MRI - maybe slight increase, but overall felt stable.  Recommended f/u in 2 years.  

## 2022-03-08 NOTE — Assessment & Plan Note (Signed)
Had colonoscopy 01/2021.  Need results.   ?

## 2022-03-08 NOTE — Assessment & Plan Note (Signed)
Previously noted to have a large hematoma - upper leg.  Has had persistent soft tissue mass.  No pain.  Smaller area. She is concerned regarding persistence.  Will have surgery evaluate to confirm no further intervention warranted.   ?

## 2022-03-08 NOTE — Assessment & Plan Note (Signed)
Low cholesterol diet and exercise.  Follow lipid panel.   

## 2022-03-08 NOTE — Assessment & Plan Note (Signed)
Had the two episodes as outlined (one year apart).  Recent MRI stable.  No other symptoms.  No headache or light headedness.  Resolved with ear irrigation.  No cerumen impaction today on exam.  Discussed intermittent debrox.  Follow.   ?

## 2022-03-14 ENCOUNTER — Encounter: Payer: Self-pay | Admitting: Internal Medicine

## 2022-03-14 DIAGNOSIS — T148XXA Other injury of unspecified body region, initial encounter: Secondary | ICD-10-CM

## 2022-03-15 NOTE — Telephone Encounter (Signed)
Order placed for surgery referral.  

## 2022-03-19 LAB — CYTOLOGY - PAP
Comment: NEGATIVE
Diagnosis: NEGATIVE
High risk HPV: NEGATIVE

## 2022-05-10 ENCOUNTER — Other Ambulatory Visit: Payer: Self-pay | Admitting: Physician Assistant

## 2022-05-10 DIAGNOSIS — S82001A Unspecified fracture of right patella, initial encounter for closed fracture: Secondary | ICD-10-CM

## 2022-05-15 ENCOUNTER — Ambulatory Visit
Admission: RE | Admit: 2022-05-15 | Discharge: 2022-05-15 | Disposition: A | Discharge status: Home / Self Care | Payer: BC Managed Care – PPO | Source: Ambulatory Visit | Attending: Physician Assistant | Admitting: Physician Assistant

## 2022-05-15 DIAGNOSIS — S82041A Displaced comminuted fracture of right patella, initial encounter for closed fracture: Secondary | ICD-10-CM | POA: Insufficient documentation

## 2022-05-15 DIAGNOSIS — W010XXA Fall on same level from slipping, tripping and stumbling without subsequent striking against object, initial encounter: Secondary | ICD-10-CM | POA: Diagnosis not present

## 2022-05-15 DIAGNOSIS — S82001A Unspecified fracture of right patella, initial encounter for closed fracture: Secondary | ICD-10-CM

## 2022-05-15 DIAGNOSIS — M25461 Effusion, right knee: Secondary | ICD-10-CM | POA: Insufficient documentation

## 2022-05-16 DIAGNOSIS — S82001A Unspecified fracture of right patella, initial encounter for closed fracture: Secondary | ICD-10-CM | POA: Diagnosis not present

## 2022-05-17 ENCOUNTER — Ambulatory Visit: Payer: BC Managed Care – PPO

## 2022-05-17 DIAGNOSIS — M25561 Pain in right knee: Secondary | ICD-10-CM | POA: Diagnosis not present

## 2022-05-17 DIAGNOSIS — S82041A Displaced comminuted fracture of right patella, initial encounter for closed fracture: Secondary | ICD-10-CM | POA: Diagnosis not present

## 2022-05-17 DIAGNOSIS — G8918 Other acute postprocedural pain: Secondary | ICD-10-CM | POA: Diagnosis not present

## 2022-05-17 DIAGNOSIS — S82001A Unspecified fracture of right patella, initial encounter for closed fracture: Secondary | ICD-10-CM | POA: Diagnosis not present

## 2022-05-17 DIAGNOSIS — Z85820 Personal history of malignant melanoma of skin: Secondary | ICD-10-CM | POA: Diagnosis not present

## 2022-05-17 DIAGNOSIS — W19XXXA Unspecified fall, initial encounter: Secondary | ICD-10-CM | POA: Diagnosis not present

## 2022-05-30 DIAGNOSIS — Z4889 Encounter for other specified surgical aftercare: Secondary | ICD-10-CM | POA: Diagnosis not present

## 2022-05-31 DIAGNOSIS — M25561 Pain in right knee: Secondary | ICD-10-CM | POA: Diagnosis not present

## 2022-05-31 DIAGNOSIS — M25661 Stiffness of right knee, not elsewhere classified: Secondary | ICD-10-CM | POA: Diagnosis not present

## 2022-06-04 DIAGNOSIS — M25661 Stiffness of right knee, not elsewhere classified: Secondary | ICD-10-CM | POA: Diagnosis not present

## 2022-06-04 DIAGNOSIS — M25561 Pain in right knee: Secondary | ICD-10-CM | POA: Diagnosis not present

## 2022-06-07 DIAGNOSIS — M25661 Stiffness of right knee, not elsewhere classified: Secondary | ICD-10-CM | POA: Diagnosis not present

## 2022-06-07 DIAGNOSIS — M25561 Pain in right knee: Secondary | ICD-10-CM | POA: Diagnosis not present

## 2022-06-11 DIAGNOSIS — M25561 Pain in right knee: Secondary | ICD-10-CM | POA: Diagnosis not present

## 2022-06-11 DIAGNOSIS — M25661 Stiffness of right knee, not elsewhere classified: Secondary | ICD-10-CM | POA: Diagnosis not present

## 2022-06-13 DIAGNOSIS — M25661 Stiffness of right knee, not elsewhere classified: Secondary | ICD-10-CM | POA: Diagnosis not present

## 2022-06-13 DIAGNOSIS — M25561 Pain in right knee: Secondary | ICD-10-CM | POA: Diagnosis not present

## 2022-06-15 HISTORY — PX: KNEE SURGERY: SHX244

## 2022-06-19 DIAGNOSIS — M25561 Pain in right knee: Secondary | ICD-10-CM | POA: Diagnosis not present

## 2022-06-19 DIAGNOSIS — M25661 Stiffness of right knee, not elsewhere classified: Secondary | ICD-10-CM | POA: Diagnosis not present

## 2022-06-21 DIAGNOSIS — M25561 Pain in right knee: Secondary | ICD-10-CM | POA: Diagnosis not present

## 2022-06-21 DIAGNOSIS — M25661 Stiffness of right knee, not elsewhere classified: Secondary | ICD-10-CM | POA: Diagnosis not present

## 2022-06-26 DIAGNOSIS — M25661 Stiffness of right knee, not elsewhere classified: Secondary | ICD-10-CM | POA: Diagnosis not present

## 2022-06-26 DIAGNOSIS — M25561 Pain in right knee: Secondary | ICD-10-CM | POA: Diagnosis not present

## 2022-06-28 DIAGNOSIS — M25661 Stiffness of right knee, not elsewhere classified: Secondary | ICD-10-CM | POA: Diagnosis not present

## 2022-06-28 DIAGNOSIS — M25561 Pain in right knee: Secondary | ICD-10-CM | POA: Diagnosis not present

## 2022-07-01 DIAGNOSIS — M25561 Pain in right knee: Secondary | ICD-10-CM | POA: Diagnosis not present

## 2022-07-01 DIAGNOSIS — M25661 Stiffness of right knee, not elsewhere classified: Secondary | ICD-10-CM | POA: Diagnosis not present

## 2022-07-03 DIAGNOSIS — M25561 Pain in right knee: Secondary | ICD-10-CM | POA: Diagnosis not present

## 2022-07-03 DIAGNOSIS — M25661 Stiffness of right knee, not elsewhere classified: Secondary | ICD-10-CM | POA: Diagnosis not present

## 2022-07-05 DIAGNOSIS — M25561 Pain in right knee: Secondary | ICD-10-CM | POA: Diagnosis not present

## 2022-07-05 DIAGNOSIS — M25661 Stiffness of right knee, not elsewhere classified: Secondary | ICD-10-CM | POA: Diagnosis not present

## 2022-07-08 DIAGNOSIS — M25561 Pain in right knee: Secondary | ICD-10-CM | POA: Diagnosis not present

## 2022-07-08 DIAGNOSIS — M25661 Stiffness of right knee, not elsewhere classified: Secondary | ICD-10-CM | POA: Diagnosis not present

## 2022-07-19 DIAGNOSIS — R600 Localized edema: Secondary | ICD-10-CM | POA: Diagnosis not present

## 2022-07-19 DIAGNOSIS — M25661 Stiffness of right knee, not elsewhere classified: Secondary | ICD-10-CM | POA: Diagnosis not present

## 2022-07-19 DIAGNOSIS — M25561 Pain in right knee: Secondary | ICD-10-CM | POA: Diagnosis not present

## 2022-07-22 DIAGNOSIS — M25661 Stiffness of right knee, not elsewhere classified: Secondary | ICD-10-CM | POA: Diagnosis not present

## 2022-07-22 DIAGNOSIS — M25561 Pain in right knee: Secondary | ICD-10-CM | POA: Diagnosis not present

## 2022-07-24 ENCOUNTER — Encounter: Payer: Self-pay | Admitting: Internal Medicine

## 2022-07-24 DIAGNOSIS — S82009A Unspecified fracture of unspecified patella, initial encounter for closed fracture: Secondary | ICD-10-CM | POA: Insufficient documentation

## 2022-07-24 DIAGNOSIS — M25661 Stiffness of right knee, not elsewhere classified: Secondary | ICD-10-CM | POA: Diagnosis not present

## 2022-07-24 DIAGNOSIS — M25561 Pain in right knee: Secondary | ICD-10-CM | POA: Diagnosis not present

## 2022-07-30 DIAGNOSIS — M25561 Pain in right knee: Secondary | ICD-10-CM | POA: Diagnosis not present

## 2022-07-30 DIAGNOSIS — M25661 Stiffness of right knee, not elsewhere classified: Secondary | ICD-10-CM | POA: Diagnosis not present

## 2022-07-31 DIAGNOSIS — M1712 Unilateral primary osteoarthritis, left knee: Secondary | ICD-10-CM | POA: Diagnosis not present

## 2022-08-02 DIAGNOSIS — M25561 Pain in right knee: Secondary | ICD-10-CM | POA: Diagnosis not present

## 2022-08-02 DIAGNOSIS — M25661 Stiffness of right knee, not elsewhere classified: Secondary | ICD-10-CM | POA: Diagnosis not present

## 2022-08-05 DIAGNOSIS — M25661 Stiffness of right knee, not elsewhere classified: Secondary | ICD-10-CM | POA: Diagnosis not present

## 2022-08-05 DIAGNOSIS — M25561 Pain in right knee: Secondary | ICD-10-CM | POA: Diagnosis not present

## 2022-08-12 DIAGNOSIS — M25661 Stiffness of right knee, not elsewhere classified: Secondary | ICD-10-CM | POA: Diagnosis not present

## 2022-08-12 DIAGNOSIS — M25561 Pain in right knee: Secondary | ICD-10-CM | POA: Diagnosis not present

## 2022-08-14 DIAGNOSIS — M1712 Unilateral primary osteoarthritis, left knee: Secondary | ICD-10-CM | POA: Diagnosis not present

## 2022-08-21 ENCOUNTER — Ambulatory Visit
Admission: RE | Admit: 2022-08-21 | Discharge: 2022-08-21 | Disposition: A | Discharge status: Home / Self Care | Payer: Medicare HMO | Source: Ambulatory Visit | Attending: Internal Medicine | Admitting: Internal Medicine

## 2022-08-21 DIAGNOSIS — Z1231 Encounter for screening mammogram for malignant neoplasm of breast: Secondary | ICD-10-CM | POA: Diagnosis not present

## 2022-08-21 DIAGNOSIS — M1712 Unilateral primary osteoarthritis, left knee: Secondary | ICD-10-CM | POA: Diagnosis not present

## 2022-08-27 ENCOUNTER — Encounter: Payer: Self-pay | Admitting: Internal Medicine

## 2022-08-28 DIAGNOSIS — M1712 Unilateral primary osteoarthritis, left knee: Secondary | ICD-10-CM | POA: Diagnosis not present

## 2022-09-09 ENCOUNTER — Other Ambulatory Visit (INDEPENDENT_AMBULATORY_CARE_PROVIDER_SITE_OTHER): Payer: Medicare HMO

## 2022-09-09 DIAGNOSIS — E7800 Pure hypercholesterolemia, unspecified: Secondary | ICD-10-CM | POA: Diagnosis not present

## 2022-09-09 LAB — LIPID PANEL
Cholesterol: 210 mg/dL — ABNORMAL HIGH (ref 0–200)
HDL: 68 mg/dL (ref >39.00)
LDL Cholesterol: 131 mg/dL — ABNORMAL HIGH (ref 0–99)
NonHDL: 142.48
Total CHOL/HDL Ratio: 3
Triglycerides: 57 mg/dL (ref 0.0–149.0)
VLDL: 11.4 mg/dL (ref 0.0–40.0)

## 2022-09-09 LAB — COMPREHENSIVE METABOLIC PANEL
ALT: 15 U/L (ref 0–35)
AST: 18 U/L (ref 0–37)
Albumin: 4.3 g/dL (ref 3.5–5.2)
Alkaline Phosphatase: 76 U/L (ref 39–117)
BUN: 20 mg/dL (ref 6–23)
CO2: 28 mEq/L (ref 19–32)
Calcium: 9.4 mg/dL (ref 8.4–10.5)
Chloride: 105 mEq/L (ref 96–112)
Creatinine, Ser: 0.8 mg/dL (ref 0.40–1.20)
GFR: 77.41 mL/min (ref >60.00)
Glucose, Bld: 86 mg/dL (ref 70–99)
Potassium: 4.2 mEq/L (ref 3.5–5.1)
Sodium: 141 mEq/L (ref 135–145)
Total Bilirubin: 1.3 mg/dL — ABNORMAL HIGH (ref 0.2–1.2)
Total Protein: 6.7 g/dL (ref 6.0–8.3)

## 2022-09-09 LAB — CBC WITH DIFFERENTIAL/PLATELET
Basophils Absolute: 0.1 10*3/uL (ref 0.0–0.1)
Basophils Relative: 1.2 % (ref 0.0–3.0)
Eosinophils Absolute: 0.1 10*3/uL (ref 0.0–0.7)
Eosinophils Relative: 2.1 % (ref 0.0–5.0)
HCT: 43.4 % (ref 36.0–46.0)
Hemoglobin: 14.6 g/dL (ref 12.0–15.0)
Lymphocytes Relative: 25.3 % (ref 12.0–46.0)
Lymphs Abs: 1.2 10*3/uL (ref 0.7–4.0)
MCHC: 33.6 g/dL (ref 30.0–36.0)
MCV: 96.6 fl (ref 78.0–100.0)
Monocytes Absolute: 0.5 10*3/uL (ref 0.1–1.0)
Monocytes Relative: 10 % (ref 3.0–12.0)
Neutro Abs: 2.8 10*3/uL (ref 1.4–7.7)
Neutrophils Relative %: 61.4 % (ref 43.0–77.0)
Platelets: 207 10*3/uL (ref 150.0–400.0)
RBC: 4.49 Mil/uL (ref 3.87–5.11)
RDW: 13.2 % (ref 11.5–15.5)
WBC: 4.6 10*3/uL (ref 4.0–10.5)

## 2022-09-10 ENCOUNTER — Other Ambulatory Visit: Payer: Self-pay | Admitting: Internal Medicine

## 2022-09-10 ENCOUNTER — Telehealth: Payer: Self-pay | Admitting: *Deleted

## 2022-09-10 NOTE — Progress Notes (Signed)
Order placed for add on lab.  °

## 2022-09-10 NOTE — Telephone Encounter (Signed)
Unable to add on Direct Bilirubin due to stability of the tube (requires a light protected specimen).

## 2022-09-12 ENCOUNTER — Other Ambulatory Visit: Payer: Self-pay | Admitting: Internal Medicine

## 2022-09-12 NOTE — Progress Notes (Signed)
Order placed for f/u liver panel.  

## 2022-09-16 DIAGNOSIS — D1724 Benign lipomatous neoplasm of skin and subcutaneous tissue of left leg: Secondary | ICD-10-CM | POA: Diagnosis not present

## 2022-09-18 DIAGNOSIS — M1712 Unilateral primary osteoarthritis, left knee: Secondary | ICD-10-CM | POA: Diagnosis not present

## 2022-09-19 DIAGNOSIS — R269 Unspecified abnormalities of gait and mobility: Secondary | ICD-10-CM | POA: Diagnosis not present

## 2022-09-19 DIAGNOSIS — M6281 Muscle weakness (generalized): Secondary | ICD-10-CM | POA: Diagnosis not present

## 2022-09-27 DIAGNOSIS — R269 Unspecified abnormalities of gait and mobility: Secondary | ICD-10-CM | POA: Diagnosis not present

## 2022-09-27 DIAGNOSIS — M6281 Muscle weakness (generalized): Secondary | ICD-10-CM | POA: Diagnosis not present

## 2022-10-03 DIAGNOSIS — R269 Unspecified abnormalities of gait and mobility: Secondary | ICD-10-CM | POA: Diagnosis not present

## 2022-10-03 DIAGNOSIS — M6281 Muscle weakness (generalized): Secondary | ICD-10-CM | POA: Diagnosis not present

## 2022-10-05 DIAGNOSIS — R030 Elevated blood-pressure reading, without diagnosis of hypertension: Secondary | ICD-10-CM | POA: Diagnosis not present

## 2022-10-05 DIAGNOSIS — Z008 Encounter for other general examination: Secondary | ICD-10-CM | POA: Diagnosis not present

## 2022-10-05 DIAGNOSIS — Z823 Family history of stroke: Secondary | ICD-10-CM | POA: Diagnosis not present

## 2022-10-05 DIAGNOSIS — Z8249 Family history of ischemic heart disease and other diseases of the circulatory system: Secondary | ICD-10-CM | POA: Diagnosis not present

## 2022-10-05 DIAGNOSIS — M1990 Unspecified osteoarthritis, unspecified site: Secondary | ICD-10-CM | POA: Diagnosis not present

## 2022-10-10 DIAGNOSIS — M6281 Muscle weakness (generalized): Secondary | ICD-10-CM | POA: Diagnosis not present

## 2022-10-10 DIAGNOSIS — R269 Unspecified abnormalities of gait and mobility: Secondary | ICD-10-CM | POA: Diagnosis not present

## 2022-10-16 DIAGNOSIS — R269 Unspecified abnormalities of gait and mobility: Secondary | ICD-10-CM | POA: Diagnosis not present

## 2022-10-16 DIAGNOSIS — M6281 Muscle weakness (generalized): Secondary | ICD-10-CM | POA: Diagnosis not present

## 2022-10-18 ENCOUNTER — Other Ambulatory Visit (INDEPENDENT_AMBULATORY_CARE_PROVIDER_SITE_OTHER): Payer: Medicare HMO

## 2022-10-18 ENCOUNTER — Other Ambulatory Visit: Payer: BC Managed Care – PPO

## 2022-10-18 LAB — BILIRUBIN, DIRECT: Bilirubin, Direct: 0.1 mg/dL (ref 0.0–0.3)

## 2022-10-18 LAB — HEPATIC FUNCTION PANEL
ALT: 16 U/L (ref 0–35)
AST: 21 U/L (ref 0–37)
Albumin: 4.1 g/dL (ref 3.5–5.2)
Alkaline Phosphatase: 72 U/L (ref 39–117)
Bilirubin, Direct: 0.1 mg/dL (ref 0.0–0.3)
Total Bilirubin: 0.7 mg/dL (ref 0.2–1.2)
Total Protein: 6.6 g/dL (ref 6.0–8.3)

## 2022-10-21 DIAGNOSIS — R269 Unspecified abnormalities of gait and mobility: Secondary | ICD-10-CM | POA: Diagnosis not present

## 2022-10-21 DIAGNOSIS — M6281 Muscle weakness (generalized): Secondary | ICD-10-CM | POA: Diagnosis not present

## 2022-10-24 DIAGNOSIS — M6281 Muscle weakness (generalized): Secondary | ICD-10-CM | POA: Diagnosis not present

## 2022-10-24 DIAGNOSIS — R269 Unspecified abnormalities of gait and mobility: Secondary | ICD-10-CM | POA: Diagnosis not present

## 2022-12-10 DIAGNOSIS — J0100 Acute maxillary sinusitis, unspecified: Secondary | ICD-10-CM | POA: Diagnosis not present

## 2022-12-10 DIAGNOSIS — R300 Dysuria: Secondary | ICD-10-CM | POA: Diagnosis not present

## 2022-12-11 ENCOUNTER — Encounter: Payer: Self-pay | Admitting: Internal Medicine

## 2022-12-13 ENCOUNTER — Encounter: Payer: Self-pay | Admitting: Family

## 2022-12-13 ENCOUNTER — Ambulatory Visit (INDEPENDENT_AMBULATORY_CARE_PROVIDER_SITE_OTHER): Payer: Medicare HMO | Admitting: Family

## 2022-12-13 VITALS — BP 128/78 | HR 87 | Temp 97.8°F | Ht 65.0 in | Wt 154.2 lb

## 2022-12-13 DIAGNOSIS — R300 Dysuria: Secondary | ICD-10-CM | POA: Diagnosis not present

## 2022-12-13 DIAGNOSIS — H9202 Otalgia, left ear: Secondary | ICD-10-CM | POA: Diagnosis not present

## 2022-12-13 LAB — URINALYSIS, ROUTINE W REFLEX MICROSCOPIC
Bilirubin Urine: NEGATIVE
Hgb urine dipstick: NEGATIVE
Leukocytes,Ua: NEGATIVE
Nitrite: NEGATIVE
RBC / HPF: NONE SEEN (ref 0–?)
Specific Gravity, Urine: 1.02 (ref 1.000–1.030)
Total Protein, Urine: NEGATIVE
Urine Glucose: NEGATIVE
Urobilinogen, UA: 0.2 (ref 0.0–1.0)
pH: 6 (ref 5.0–8.0)

## 2022-12-13 NOTE — Progress Notes (Signed)
Assessment & Plan:  Left ear pain Assessment & Plan: Overall benign exam.  No swelling of the parotid gland.  Long discussion with patient presentation is atypical for otitis media or otitis externa.  I question if this could perhaps be related to TMJ.  I also question if related to degenerative disc disease of the cervical spine and if pain would persist would opt to pursue x-ray cervical spine.  I placed a referral to ENT for second opinion.  Patient will complete Augmentin as started at [facility].  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  She will let me know how she is doing  Orders: -     Ambulatory referral to ENT  Dysuria -     Urinalysis, Routine w reflex microscopic -     Urine Culture     Return precautions given.   Risks, benefits, and alternatives of the medications and treatment plan prescribed today were discussed, and patient expressed understanding.   Education regarding symptom management and diagnosis given to patient on AVS either electronically or printed.  Return in about 1 week (around 12/20/2022).  Carla Horne, Carla Horne  Subjective:    Patient ID: Carla Horne, female    DOB: 04-Sep-1956, 66 y.o.   MRN: [id number]  CC: Carla Horne is a 66 y.o. female who presents today for an acute visit.    HPI: Complains of left ear pain x 4 days.   Started augmentin 3 days ago from [facility].  Congestion has improved. Ear pain is worse and is on the 'outside of ear'. She has pain at right base of head on right side. Which started 4 days ago.   No fever, chills, HA, vision changes, pain with eating.   She doesn't grind her teeth to her knowledge.   Taking tylenol with temporary relief. She has used warm water to clean out ears.  She felt dizzy when she stood earlier this morning.  No cp, palpitations, syncope.   Drinking water.   She complains of dysuria      Allergies:  Prednisone Current Outpatient Medications on File Prior to Visit  Medication Sig Dispense Refill   amoxicillin-clavulanate (AUGMENTIN) 875-125 MG tablet Take 1 tablet by mouth 2 (two) times daily.     No current facility-administered medications on file prior to visit.    Review of Systems  Constitutional:  Negative for chills and fever.  HENT:  Positive for ear pain. Negative for congestion and ear discharge.   Eyes:  Negative for visual disturbance.  Respiratory:  Negative for cough.   Cardiovascular:  Negative for chest pain and palpitations.  Gastrointestinal:  Negative for nausea and vomiting.  Neurological:  Positive for dizziness (one episode). Negative for syncope.      Objective:    BP 128/78   Pulse 87   Temp 97.8 F (36.6 C) (Oral)   Ht 5\' 5"  (1.651 m)   Wt 154 lb 3.2 oz (69.9 kg)   SpO2 96%   BMI 25.66 kg/m   BP Readings from Last 3 Encounters:  12/13/22 128/78  03/07/22 118/70  04/03/21 (!) 119/57   Wt Readings from Last 3 Encounters:  12/13/22 154 lb 3.2 oz (69.9 kg)  03/07/22 153 lb (69.4 kg)  03/31/21 142 lb (64.4 kg)    Physical Exam Vitals reviewed.  Constitutional:      Appearance: She is well-developed.  HENT:  Head: Normocephalic and atraumatic.      Comments: Pain as described by patient left preauricular area.  No edema, erythema, lymphadenopathy appreciated.  No swelling of the parotid gland . no pain, crepitus elicited with palpation, chewing maneuvers up and down or side.  Pain as described right postauricular area. No edema, erythema, lymphadenopathy appreciated. No pain elicited with palpation    Right Ear: Hearing, tympanic membrane, ear canal and external ear normal. No decreased hearing noted. No drainage, swelling or tenderness. No middle ear effusion. No foreign body. Tympanic membrane is not erythematous or bulging.     Left Ear: Hearing, tympanic membrane, ear canal and external ear normal. No decreased hearing noted. No  drainage, swelling or tenderness.  No middle ear effusion. No foreign body. Tympanic membrane is not erythematous or bulging.     Nose: Nose normal. No rhinorrhea.     Right Sinus: No maxillary sinus tenderness or frontal sinus tenderness.     Left Sinus: No maxillary sinus tenderness or frontal sinus tenderness.     Mouth/Throat:     Pharynx: Uvula midline. No oropharyngeal exudate or posterior oropharyngeal erythema.     Tonsils: No tonsillar abscesses.  Eyes:     Conjunctiva/sclera: Conjunctivae normal.  Cardiovascular:     Rate and Rhythm: Regular rhythm.     Pulses: Normal pulses.     Heart sounds: Normal heart sounds.  Pulmonary:     Effort: Pulmonary effort is normal.     Breath sounds: Normal breath sounds. No wheezing, rhonchi or rales.  Lymphadenopathy:     Head:     Right side of head: No submental, submandibular, tonsillar, preauricular, posterior auricular or occipital adenopathy.     Left side of head: No submental, submandibular, tonsillar, preauricular, posterior auricular or occipital adenopathy.     Cervical: No cervical adenopathy.  Skin:    General: Skin is warm and dry.  Neurological:     Mental Status: She is alert.  Psychiatric:        Speech: Speech normal.        Behavior: Behavior normal.        Thought Content: Thought content normal.

## 2022-12-13 NOTE — Patient Instructions (Signed)
Trial of mobic 7.'5mg'$  daily which you have at home  I suspect this may be an atupiucal [presention of

## 2022-12-14 LAB — URINE CULTURE
MICRO NUMBER:: 14370417
Result:: NO GROWTH
SPECIMEN QUALITY:: ADEQUATE

## 2022-12-15 IMAGING — CT CT KNEE*R* W/O CM
3 of 6 series · 10 of 33 positions shown, 11 images · non-contrast
Comparison: None Available.

CLINICAL DATA: Right patellar fracture.

EXAM:
CT OF THE RIGHT KNEE WITHOUT CONTRAST
TECHNIQUE: Multidetector CT imaging of the right knee was performed according
to the standard protocol. Multiplanar CT image reconstructions were
also generated.
RADIATION DOSE REDUCTION: This exam was performed according to the
departmental dose-optimization program which includes automated
exposure control, adjustment of the mA and/or kV according to
patient size and/or use of iterative reconstruction technique.

[Series 3: axial bone lfov lower extremity 1.50 ax · axial · 0.26mm/px · z∈[-1956,-1759]mm · 4 of 374 slices shown, 5 images]
[im 63/374  soft-tissue]
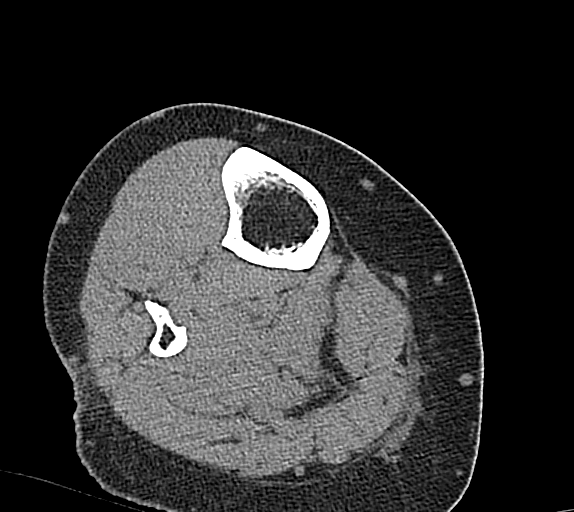
[im 63/374  bone]
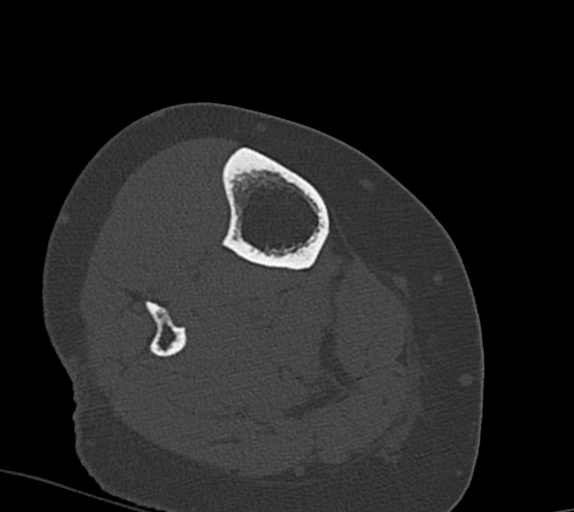
[im 125/374  bone]
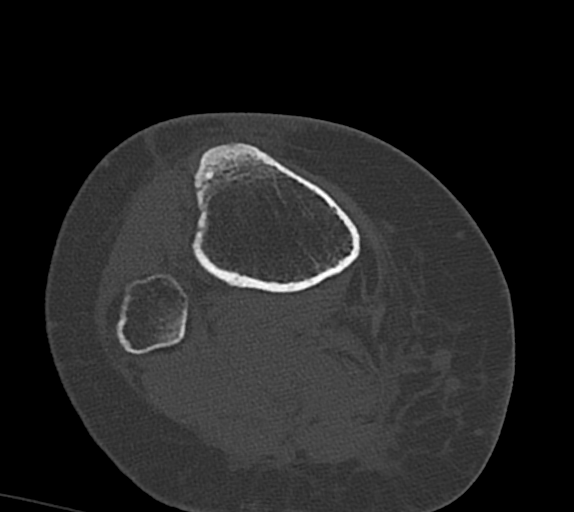
[im 249/374  bone]
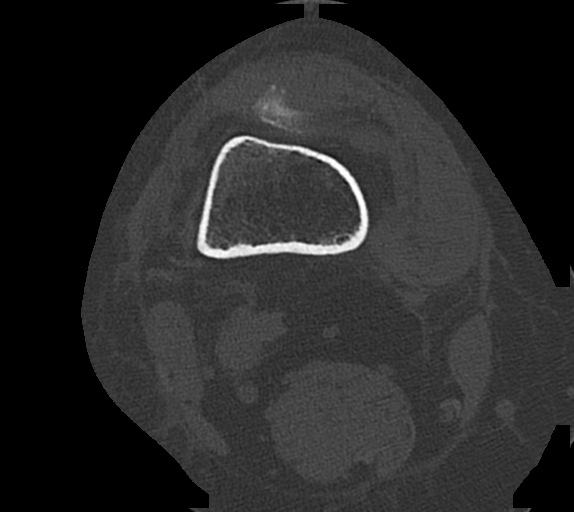
[im 311/374  bone]
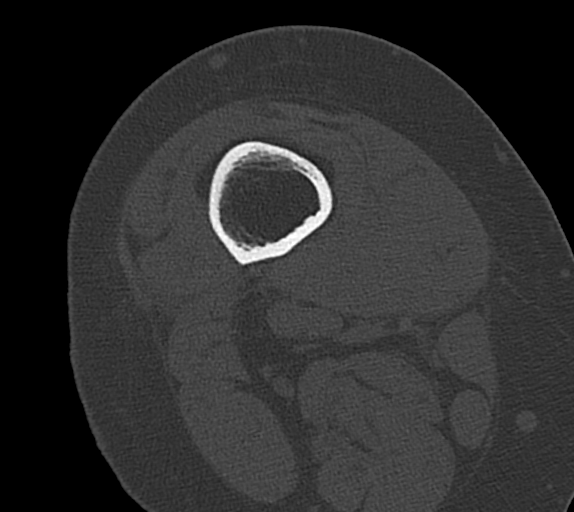

[Series 9: coronal bone lfov lower extremity 1.50 cor · coronal · 0.29mm/px · 1 of 166 slices shown]
[im 83/166  bone]
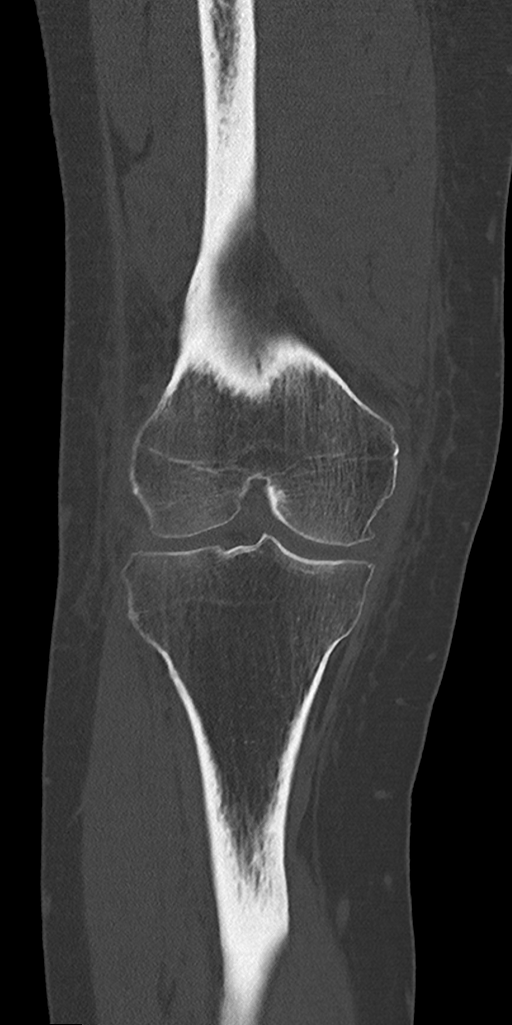

[Series 13: sagittal bone lfov lower extremity 1.50 sag · sagittal · 0.26mm/px · 5 of 186 slices shown]
[im 31/186  bone]
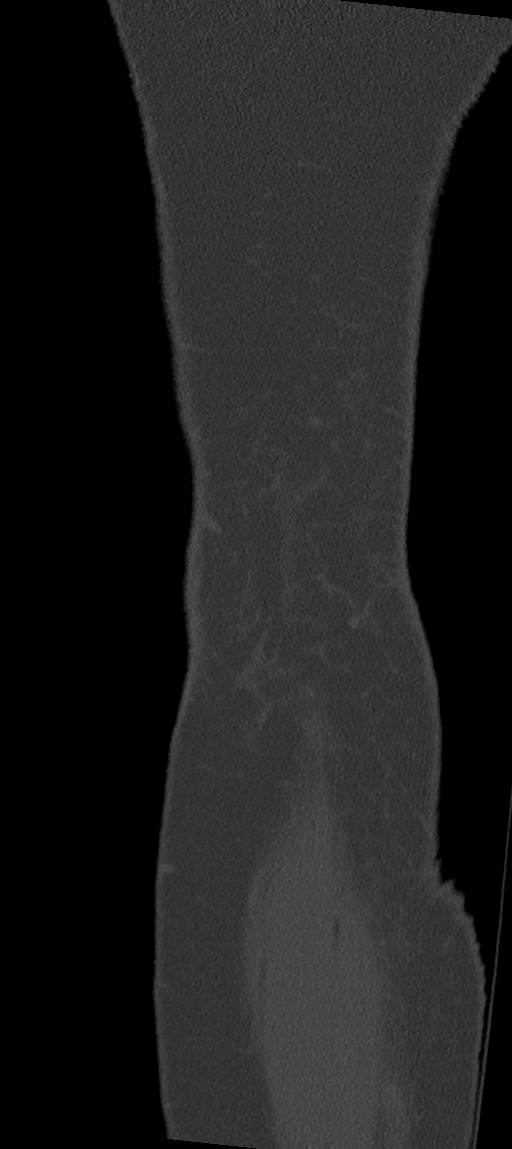
[im 62/186  bone]
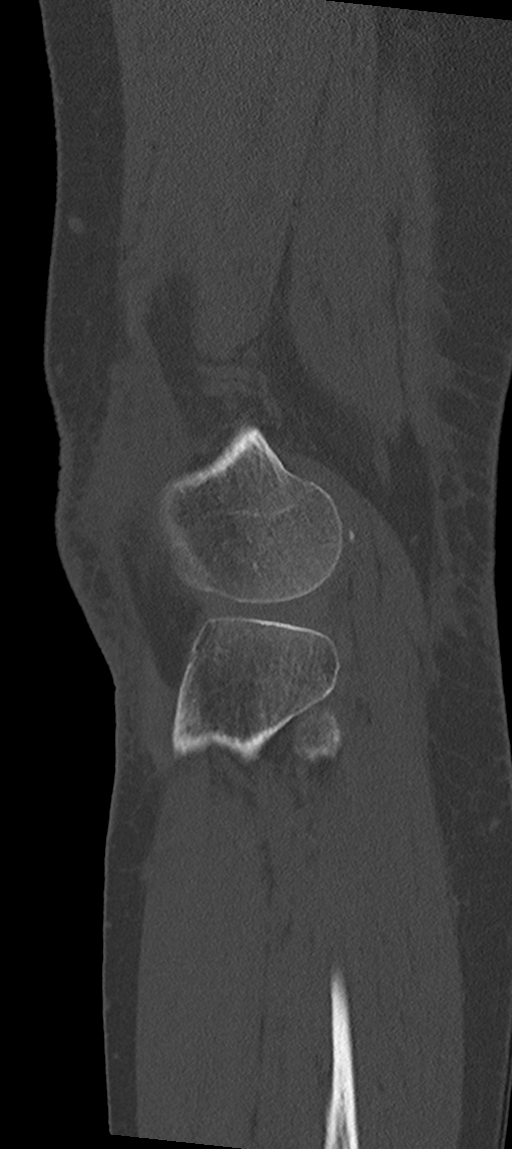
[im 93/186  bone]
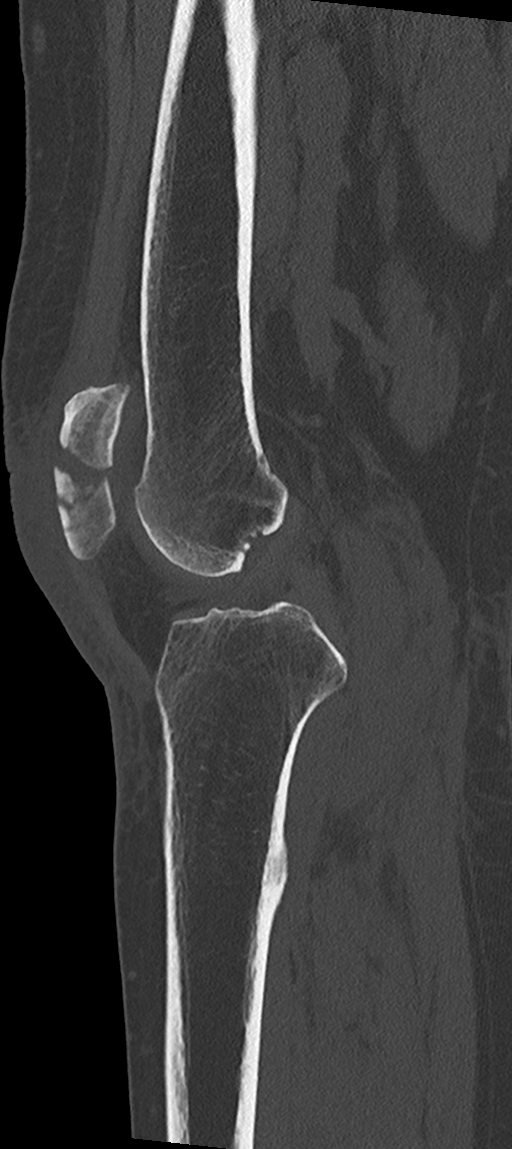
[im 124/186  bone]
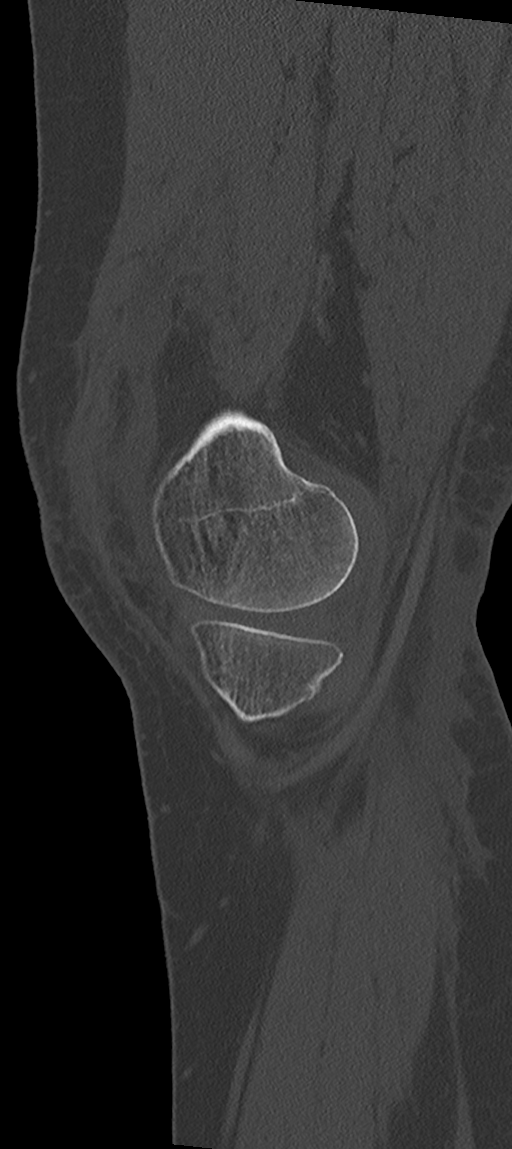
[im 155/186  bone]
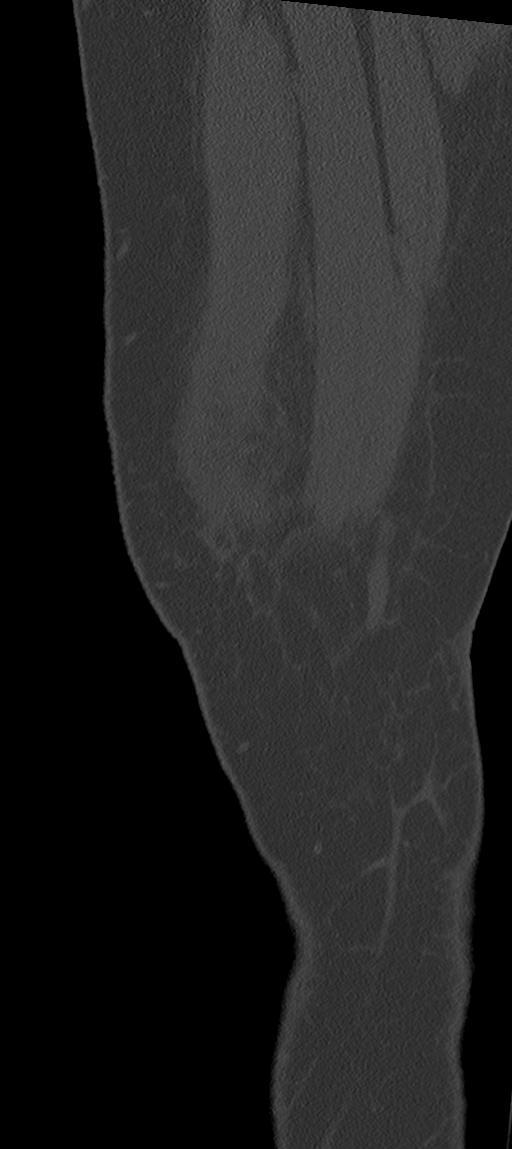

[10 of 33 positions shown; findings below may reference images not displayed]

FINDINGS: Bones/Joint/Cartilage

There is a comminuted displaced fracture of the patella. There is a
transverse fracture component with approximately 7 mm displacement
of the fracture fragment. There is also a vertical component which
extends inferiorly to the patellar apex and approximately 2 mm
displacement. There is a small suprapatellar joint effusion with fat
fluid level.

Ligaments

Suboptimally assessed by CT.

Muscles and Tendons

Muscles are normal in bulk. No muscle atrophy. No intramuscular
fluid collection or hematoma. The quadriceps and patellar tendon
appear intact.

Soft tissues

Soft tissue swelling and edema about the anterior aspect of the
patella.
IMPRESSION: 1. Comminuted (multi fragmented) displaced fracture of the patella
as detailed above.

2.  Small suprapatellar joint effusion with fat fluid level.

3.  No other appreciable fracture.

3. Muscles are unremarkable. No evidence of tendon tear including
quadriceps and patellar tendons. Soft tissue swelling about the
anterior aspect of the patella.

## 2022-12-17 NOTE — Assessment & Plan Note (Addendum)
Overall benign exam.  No swelling of the parotid gland.  Long discussion with patient presentation is atypical for otitis media or otitis externa.  I question if this could perhaps be related to TMJ.  I also question if related to degenerative disc disease of the cervical spine and if pain would persist would opt to pursue x-ray cervical spine.  I placed a referral to ENT for second opinion.  Patient will complete Augmentin as started at [facility].  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  She will let me know how she is doing

## 2022-12-19 ENCOUNTER — Ambulatory Visit (INDEPENDENT_AMBULATORY_CARE_PROVIDER_SITE_OTHER): Payer: Medicare HMO | Admitting: Internal Medicine

## 2022-12-19 ENCOUNTER — Encounter: Payer: Self-pay | Admitting: Internal Medicine

## 2022-12-19 VITALS — BP 130/80 | HR 86 | Temp 98.0°F | Resp 14 | Ht 65.0 in | Wt 149.6 lb

## 2022-12-19 DIAGNOSIS — Z136 Encounter for screening for cardiovascular disorders: Secondary | ICD-10-CM | POA: Diagnosis not present

## 2022-12-19 DIAGNOSIS — D329 Benign neoplasm of meninges, unspecified: Secondary | ICD-10-CM

## 2022-12-19 DIAGNOSIS — R531 Weakness: Secondary | ICD-10-CM

## 2022-12-19 DIAGNOSIS — H9202 Otalgia, left ear: Secondary | ICD-10-CM | POA: Diagnosis not present

## 2022-12-19 NOTE — Progress Notes (Signed)
Subjective:    Patient ID: Carla Horne, female    DOB: 07-Nov-1956, 67 y.o.   MRN: [id number]  Patient here for  Chief Complaint  Patient presents with   Acute Visit    1wk f/u    HPI Reviewed records. Was seen at Urgent Care with frequent urination for one week. Also reported increased urgency and decreased appetite.  Also reported chills, nasal congestion, cough, headaches, rhinorrhea, sinus pain.  Weakness. Was placed on augmentin.  Developed pain - shooting - intermittent - left outer ear.  No change in hearing.  No headache.  Saw Margarett Arnett 12/13/22 - questioning TMJ.  Instructed to complete augmentin.  Started meloxicam.  Comes in today for f/u regarding above.  Reports ear is better.  No increased sinus congestion or pressure reported.  No rash.  No headache.  No bites.  Reports feels weak.  No energy.  Decreased appetite.  Some nausea.  No vomiting or diarrhea.  Did go to the pool this am for some exercise.  States swam/kicked legs for approximately 45 minutes.  Decreased energy and stamina with increased activity.  No chest pain reported.  No increased sob.  No abdominal pain.     Past Medical History:  Diagnosis Date   Cancer ([place])    melanoma- leg   Meningioma ([place])    Past Surgical History:  Procedure Laterality Date   Bladder Mesh Implant..1990     BREAST BIOPSY Left 20+ yrs ago   core - neg   BREAST SURGERY  1990   breast biopsy   ORIF WRIST FRACTURE Left 04/03/2021   Procedure: OPEN REDUCTION INTERNAL FIXATION (ORIF) WRIST FRACTURE, left distal radius;  Surgeon: Corky Mull, MD;  Location: [facility] ORS;  Service: Orthopedics;  Laterality: Left;   Remval of mengionoma  2010   Family History  Problem Relation Age of Onset   Hypercholesterolemia Father    Hypertension Father    Breast cancer Paternal Grandmother    Colon cancer Neg Hx    Social History   Socioeconomic History   Marital status: Married    Spouse name: Not on file   Number of children: 2    Years of education: Not on file   Highest education level: Not on file  Occupational History   Occupation: retired Pharmacist, hospital  Tobacco Use   Smoking status: Never   Smokeless tobacco: Never  Substance and Sexual Activity   Alcohol use: No    Alcohol/week: 0.0 standard drinks of alcohol   Drug use: No   Sexual activity: Not on file    Comment: LMP 2010  Other Topics Concern   Not on file  Social History Narrative   Not on file   Social Determinants of Health   Financial Resource Strain: Not on file  Food Insecurity: Not on file  Transportation Needs: Not on file  Physical Activity: Not on file  Stress: Not on file  Social Connections: Not on file     Review of Systems  Constitutional:  Positive for fatigue.       Decreased appetite.   HENT:         No increased sinus pressure or congestion.  Ear pain is better.   Respiratory:  Negative for cough, chest tightness and shortness of breath.   Cardiovascular:  Negative for chest pain, palpitations and leg swelling.  Gastrointestinal:  Positive for nausea. Negative for abdominal pain, diarrhea and vomiting.  Genitourinary:  Negative for difficulty urinating and dysuria.  Musculoskeletal:  Negative for joint swelling and myalgias.  Skin:  Negative for color change and rash.  Neurological:  Negative for dizziness and headaches.  Psychiatric/Behavioral:  Negative for agitation and dysphoric mood.        Objective:     BP 130/80 (BP Location: Left Arm, Patient Position: Sitting, Cuff Size: Small)   Pulse 86   Temp 98 F (36.7 C) (Temporal)   Resp 14   Ht '5\' 5"'$  (1.651 m)   Wt 149 lb 9.6 oz (67.9 kg)   SpO2 97%   BMI 24.89 kg/m  Wt Readings from Last 3 Encounters:  12/19/22 149 lb 9.6 oz (67.9 kg)  12/13/22 154 lb 3.2 oz (69.9 kg)  03/07/22 153 lb (69.4 kg)    Physical Exam Constitutional:      General: She is not in acute distress.    Appearance: Normal appearance.  HENT:     Head: Normocephalic and atraumatic.      Right Ear: Tympanic membrane, ear canal and external ear normal.     Left Ear: Tympanic membrane, ear canal and external ear normal.     Nose: Nose normal.     Mouth/Throat:     Pharynx: No oropharyngeal exudate or posterior oropharyngeal erythema.  Neck:     Thyroid: No thyromegaly.  Cardiovascular:     Rate and Rhythm: Normal rate and regular rhythm.  Pulmonary:     Effort: No respiratory distress.     Breath sounds: Normal breath sounds. No wheezing.  Abdominal:     General: Bowel sounds are normal.     Palpations: Abdomen is soft.     Tenderness: There is no abdominal tenderness.  Musculoskeletal:        General: No swelling or tenderness.     Cervical back: Neck supple.  Lymphadenopathy:     Cervical: No cervical adenopathy.  Skin:    Findings: No erythema or rash.  Neurological:     Mental Status: She is alert.  Psychiatric:        Mood and Affect: Mood normal.        Behavior: Behavior normal.      Outpatient Encounter Medications as of 12/19/2022  Medication Sig   [DISCONTINUED] amoxicillin-clavulanate (AUGMENTIN) 875-125 MG tablet Take 1 tablet by mouth 2 (two) times daily.   No facility-administered encounter medications on file as of 12/19/2022.     Lab Results  Component Value Date   WBC 5.9 12/19/2022   HGB 14.9 12/19/2022   HCT 44.1 12/19/2022   PLT 289.0 12/19/2022   GLUCOSE 100 (H) 12/19/2022   CHOL 210 (H) 09/09/2022   TRIG 57.0 09/09/2022   HDL 68.00 09/09/2022   LDLDIRECT 131.9 10/26/2013   LDLCALC 131 (H) 09/09/2022   ALT 11 12/19/2022   AST 15 12/19/2022   NA 135 12/19/2022   K 4.2 12/19/2022   CL 99 12/19/2022   CREATININE 0.74 12/19/2022   BUN 9 12/19/2022   CO2 29 12/19/2022   TSH 0.95 12/19/2022    MM 3D SCREEN BREAST BILATERAL  Result Date: 08/22/2022 CLINICAL DATA:  Screening. EXAM: DIGITAL SCREENING BILATERAL MAMMOGRAM WITH TOMOSYNTHESIS AND CAD TECHNIQUE: Bilateral screening digital craniocaudal and mediolateral oblique  mammograms were obtained. Bilateral screening digital breast tomosynthesis was performed. The images were evaluated with computer-aided detection. COMPARISON:  Previous exam(s). ACR Breast Density Category c: The breast tissue is heterogeneously dense, which may obscure small masses. FINDINGS: There are no findings suspicious for malignancy. IMPRESSION: No mammographic evidence of malignancy.  A result letter of this screening mammogram will be mailed directly to the patient. RECOMMENDATION: Screening mammogram in one year. (Code:[id number]-B-01Y) BI-RADS CATEGORY  1: Negative. Electronically Signed   By: Ammie Ferrier M.D.   On: 08/22/2022 07:44       Assessment & Plan:  Weakness Assessment & Plan: Reports increased weakness and "no energy".  Recent congestion as outlined.  Treated with augmentin.  No increased congestion or cough now.  Decreased appetite.  Could be residual symptoms - from recent viral infection.  Encourage increased po intake.  Stay hydrated.  Check routine labs, including electrolytes, cbc, etc.  Given increased weakness with exertion, check EKG. EKG - SR.  Compared to EKG - 2008 - no acute change noted.  In discussion, she reports family history stroke.  Discussed risk factors and further testing.  Discussed CT calcium score.  She is agreeable.  No chest pain or increased sob reported.  Follow closely.  Call with update.    Orders: -     EKG 12-Lead -     CBC with Differential/Platelet -     Basic metabolic panel -     Hepatic function panel -     TSH -     Sedimentation rate  Left ear pain Assessment & Plan: No abnormality noted on exam.  Just completed a course of augmentin.  Took meloxicam.  Pain resolved.  Follow.     Meningioma [facility]) Assessment & Plan: S/p removal of parietal tumor.  Followed by Dr Tommi Rumps.  Evaluated 12/11/21.  MRI - maybe slight increase, but overall felt stable.  Recommended f/u in 2 years.    Encounter for screening for coronary artery disease -      CT CARDIAC SCORING (SELF PAY ONLY); Future     Einar Pheasant, MD

## 2022-12-20 ENCOUNTER — Encounter: Payer: Self-pay | Admitting: Internal Medicine

## 2022-12-20 DIAGNOSIS — H524 Presbyopia: Secondary | ICD-10-CM | POA: Diagnosis not present

## 2022-12-20 DIAGNOSIS — Z135 Encounter for screening for eye and ear disorders: Secondary | ICD-10-CM | POA: Diagnosis not present

## 2022-12-20 DIAGNOSIS — H52223 Regular astigmatism, bilateral: Secondary | ICD-10-CM | POA: Diagnosis not present

## 2022-12-20 DIAGNOSIS — H5203 Hypermetropia, bilateral: Secondary | ICD-10-CM | POA: Diagnosis not present

## 2022-12-20 LAB — SEDIMENTATION RATE: Sed Rate: 16 mm/hr (ref 0–30)

## 2022-12-20 LAB — TSH: TSH: 0.95 u[IU]/mL (ref 0.35–5.50)

## 2022-12-20 LAB — CBC WITH DIFFERENTIAL/PLATELET
Basophils Absolute: 0.1 10*3/uL (ref 0.0–0.1)
Basophils Relative: 1.2 % (ref 0.0–3.0)
Eosinophils Absolute: 0.1 10*3/uL (ref 0.0–0.7)
Eosinophils Relative: 1.5 % (ref 0.0–5.0)
HCT: 44.1 % (ref 36.0–46.0)
Hemoglobin: 14.9 g/dL (ref 12.0–15.0)
Lymphocytes Relative: 25.3 % (ref 12.0–46.0)
Lymphs Abs: 1.5 10*3/uL (ref 0.7–4.0)
MCHC: 33.7 g/dL (ref 30.0–36.0)
MCV: 96.2 fl (ref 78.0–100.0)
Monocytes Absolute: 0.7 10*3/uL (ref 0.1–1.0)
Monocytes Relative: 12.2 % — ABNORMAL HIGH (ref 3.0–12.0)
Neutro Abs: 3.6 10*3/uL (ref 1.4–7.7)
Neutrophils Relative %: 59.8 % (ref 43.0–77.0)
Platelets: 289 10*3/uL (ref 150.0–400.0)
RBC: 4.58 Mil/uL (ref 3.87–5.11)
RDW: 13.3 % (ref 11.5–15.5)
WBC: 5.9 10*3/uL (ref 4.0–10.5)

## 2022-12-20 LAB — BASIC METABOLIC PANEL
BUN: 9 mg/dL (ref 6–23)
CO2: 29 mEq/L (ref 19–32)
Calcium: 9.5 mg/dL (ref 8.4–10.5)
Chloride: 99 mEq/L (ref 96–112)
Creatinine, Ser: 0.74 mg/dL (ref 0.40–1.20)
GFR: 84.83 mL/min (ref >60.00)
Glucose, Bld: 100 mg/dL — ABNORMAL HIGH (ref 70–99)
Potassium: 4.2 mEq/L (ref 3.5–5.1)
Sodium: 135 mEq/L (ref 135–145)

## 2022-12-20 LAB — HEPATIC FUNCTION PANEL
ALT: 11 U/L (ref 0–35)
AST: 15 U/L (ref 0–37)
Albumin: 4.4 g/dL (ref 3.5–5.2)
Alkaline Phosphatase: 64 U/L (ref 39–117)
Bilirubin, Direct: 0.1 mg/dL (ref 0.0–0.3)
Total Bilirubin: 0.8 mg/dL (ref 0.2–1.2)
Total Protein: 6.6 g/dL (ref 6.0–8.3)

## 2022-12-20 NOTE — Telephone Encounter (Signed)
Please call and notify - hgb is wnl.  Kidney function tests and liver function tests are wnl.  Inflammation test is wnl.  If any acute change or worsening symptoms - needs to be evaluated.  Regarding CT scan - they will call her to schedule.

## 2022-12-20 NOTE — Telephone Encounter (Signed)
Spoke with pt and informed her of her lab results. Pt gave a verbal understanding.  

## 2022-12-21 ENCOUNTER — Encounter: Payer: Self-pay | Admitting: Internal Medicine

## 2022-12-21 NOTE — Assessment & Plan Note (Signed)
S/p removal of parietal tumor.  Followed by Dr Tommi Rumps.  Evaluated 12/11/21.  MRI - maybe slight increase, but overall felt stable.  Recommended f/u in 2 years.

## 2022-12-21 NOTE — Assessment & Plan Note (Signed)
No abnormality noted on exam.  Just completed a course of augmentin.  Took meloxicam.  Pain resolved.  Follow.

## 2022-12-21 NOTE — Assessment & Plan Note (Signed)
Reports increased weakness and "no energy".  Recent congestion as outlined.  Treated with augmentin.  No increased congestion or cough now.  Decreased appetite.  Could be residual symptoms - from recent viral infection.  Encourage increased po intake.  Stay hydrated.  Check routine labs, including electrolytes, cbc, etc.  Given increased weakness with exertion, check EKG. EKG - SR.  Compared to EKG - 2008 - no acute change noted.  In discussion, she reports family history stroke.  Discussed risk factors and further testing.  Discussed CT calcium score.  She is agreeable.  No chest pain or increased sob reported.  Follow closely.  Call with update.

## 2022-12-23 NOTE — Telephone Encounter (Signed)
Discussed with her at her appt that her EKG - when compared to 2008(?) revealed no acute change.  Need to confirm how she is doing.  Any new symptoms?  What are symptoms now?

## 2022-12-24 NOTE — Telephone Encounter (Signed)
Patient returned office phone call. 

## 2022-12-24 NOTE — Telephone Encounter (Signed)
Lm for pt to cb.

## 2022-12-31 ENCOUNTER — Ambulatory Visit
Admission: RE | Admit: 2022-12-31 | Discharge: 2022-12-31 | Disposition: A | Discharge status: Home / Self Care | Payer: BC Managed Care – PPO | Source: Ambulatory Visit | Attending: Internal Medicine | Admitting: Internal Medicine

## 2022-12-31 DIAGNOSIS — Z136 Encounter for screening for cardiovascular disorders: Secondary | ICD-10-CM | POA: Insufficient documentation

## 2023-01-27 DIAGNOSIS — M170 Bilateral primary osteoarthritis of knee: Secondary | ICD-10-CM | POA: Diagnosis not present

## 2023-02-04 ENCOUNTER — Encounter: Payer: Self-pay | Admitting: Internal Medicine

## 2023-02-05 NOTE — Telephone Encounter (Signed)
Pt advised limit salt intake

## 2023-02-05 NOTE — Telephone Encounter (Signed)
Monitor increased salt/sodium intake.  (Limit).  Agree with spot checking her pressure.  If problems or persistent elevation, needs to be evaluated.

## 2023-02-17 ENCOUNTER — Ambulatory Visit: Payer: BC Managed Care – PPO | Admitting: Internal Medicine

## 2023-03-10 ENCOUNTER — Encounter: Payer: BC Managed Care – PPO | Admitting: Internal Medicine

## 2023-03-18 ENCOUNTER — Encounter: Payer: Self-pay | Admitting: Internal Medicine

## 2023-03-18 ENCOUNTER — Ambulatory Visit (INDEPENDENT_AMBULATORY_CARE_PROVIDER_SITE_OTHER): Payer: Medicare HMO | Admitting: Internal Medicine

## 2023-03-18 VITALS — BP 120/78 | HR 67 | Temp 98.0°F | Resp 16 | Ht 65.0 in | Wt 150.0 lb

## 2023-03-18 DIAGNOSIS — E7800 Pure hypercholesterolemia, unspecified: Secondary | ICD-10-CM | POA: Diagnosis not present

## 2023-03-18 DIAGNOSIS — S82001D Unspecified fracture of right patella, subsequent encounter for closed fracture with routine healing: Secondary | ICD-10-CM | POA: Diagnosis not present

## 2023-03-18 DIAGNOSIS — Z114 Encounter for screening for human immunodeficiency virus [HIV]: Secondary | ICD-10-CM | POA: Diagnosis not present

## 2023-03-18 DIAGNOSIS — C439 Malignant melanoma of skin, unspecified: Secondary | ICD-10-CM | POA: Diagnosis not present

## 2023-03-18 DIAGNOSIS — Z0000 Encounter for general adult medical examination without abnormal findings: Secondary | ICD-10-CM

## 2023-03-18 DIAGNOSIS — Z86010 Personal history of colonic polyps: Secondary | ICD-10-CM

## 2023-03-18 DIAGNOSIS — Z1231 Encounter for screening mammogram for malignant neoplasm of breast: Secondary | ICD-10-CM | POA: Diagnosis not present

## 2023-03-18 DIAGNOSIS — R69 Illness, unspecified: Secondary | ICD-10-CM | POA: Diagnosis not present

## 2023-03-18 DIAGNOSIS — D329 Benign neoplasm of meninges, unspecified: Secondary | ICD-10-CM

## 2023-03-18 NOTE — Assessment & Plan Note (Signed)
Physical today 03/18/23.  PAP 03/07/22 - negative with negative HPV (atrophy). Mammogram 08/21/22 - Birads I.  Colonoscopy 01/2021.  Need results.

## 2023-03-18 NOTE — Progress Notes (Signed)
Subjective:    Patient ID: Carla Horne, female    DOB: 27-Feb-1956, 67 y.o.   MRN: [id number]  Patient here for  Chief Complaint  Patient presents with   Annual Exam    HPI Here for her physical exam.  Evaluated 12/19/22 - decreased energy (noted after being sick).  Symptoms improved.  Had called in concerned regarding her blood pressure - elevated - while in [place].  Recheck there at urgent care - ok.   Reports she is feeling better.  No chest pain or sob.  Is exercising.  No abdominal pain or bowel change. S/p closed fracture of right patella.  S/p surgery (patella ORIF - 05/2022.  PT.  Doing spin classes and water aerobics.  Overall feels things are relatively stable.    Past Medical History:  Diagnosis Date   Cancer    melanoma- leg   Meningioma    Past Surgical History:  Procedure Laterality Date   Bladder Mesh Implant..1990     BREAST BIOPSY Left 20+ yrs ago   core - neg   BREAST SURGERY  1990   breast biopsy   KNEE SURGERY Right 06/2022   ORIF WRIST FRACTURE Left 04/03/2021   Procedure: OPEN REDUCTION INTERNAL FIXATION (ORIF) WRIST FRACTURE, left distal radius;  Surgeon: Christena Flake, MD;  Location: [facility] ORS;  Service: Orthopedics;  Laterality: Left;   Remval of mengionoma  2010   Family History  Problem Relation Age of Onset   Hypercholesterolemia Father    Hypertension Father    Breast cancer Paternal Grandmother    Colon cancer Neg Hx    Social History   Socioeconomic History   Marital status: Married    Spouse name: Not on file   Number of children: 2   Years of education: Not on file   Highest education level: Not on file  Occupational History   Occupation: retired Runner, broadcasting/film/video  Tobacco Use   Smoking status: Never   Smokeless tobacco: Never  Substance and Sexual Activity   Alcohol use: No    Alcohol/week: 0.0 standard drinks of alcohol   Drug use: No   Sexual activity: Not on file    Comment: LMP 2010  Other Topics Concern   Not on file  Social  History Narrative   Not on file   Social Determinants of Health   Financial Resource Strain: Not on file  Food Insecurity: Not on file  Transportation Needs: Not on file  Physical Activity: Not on file  Stress: Not on file  Social Connections: Not on file     Review of Systems  Constitutional:  Negative for appetite change and unexpected weight change.  HENT:  Negative for congestion, sinus pressure and sore throat.   Eyes:  Negative for pain and visual disturbance.  Respiratory:  Negative for cough, chest tightness and shortness of breath.   Cardiovascular:  Negative for chest pain and palpitations.  Gastrointestinal:  Negative for abdominal pain, diarrhea, nausea and vomiting.  Genitourinary:  Negative for difficulty urinating and dysuria.  Musculoskeletal:  Negative for joint swelling and myalgias.  Skin:  Negative for color change and rash.  Neurological:  Negative for dizziness and headaches.  Hematological:  Negative for adenopathy. Does not bruise/bleed easily.  Psychiatric/Behavioral:  Negative for agitation and dysphoric mood.        Objective:     BP 120/78   Pulse 67   Temp 98 F (36.7 C)   Resp 16   Ht 5\' 5"  (  1.651 m)   Wt 150 lb (68 kg)   SpO2 98%   BMI 24.96 kg/m  Wt Readings from Last 3 Encounters:  03/18/23 150 lb (68 kg)  12/19/22 149 lb 9.6 oz (67.9 kg)  12/13/22 154 lb 3.2 oz (69.9 kg)    Physical Exam Vitals reviewed.  Constitutional:      General: She is not in acute distress.    Appearance: Normal appearance. She is well-developed.  HENT:     Head: Normocephalic and atraumatic.     Right Ear: External ear normal.     Left Ear: External ear normal.  Eyes:     General: No scleral icterus.       Right eye: No discharge.        Left eye: No discharge.     Conjunctiva/sclera: Conjunctivae normal.  Neck:     Thyroid: No thyromegaly.  Cardiovascular:     Rate and Rhythm: Normal rate and regular rhythm.  Pulmonary:     Effort: No  tachypnea, accessory muscle usage or respiratory distress.     Breath sounds: Normal breath sounds. No decreased breath sounds or wheezing.  Chest:  Breasts:    Right: No inverted nipple, mass, nipple discharge or tenderness (no axillary adenopathy).     Left: No inverted nipple, mass, nipple discharge or tenderness (no axilarry adenopathy).  Abdominal:     General: Bowel sounds are normal.     Palpations: Abdomen is soft.     Tenderness: There is no abdominal tenderness.  Musculoskeletal:        General: No swelling or tenderness.     Cervical back: Neck supple.  Lymphadenopathy:     Cervical: No cervical adenopathy.  Skin:    Findings: No erythema or rash.  Neurological:     Mental Status: She is alert and oriented to person, place, and time.  Psychiatric:        Mood and Affect: Mood normal.        Behavior: Behavior normal.      No outpatient encounter medications on file as of 03/18/2023.   No facility-administered encounter medications on file as of 03/18/2023.     Lab Results  Component Value Date   WBC 5.9 12/19/2022   HGB 14.9 12/19/2022   HCT 44.1 12/19/2022   PLT 289.0 12/19/2022   GLUCOSE 100 (H) 12/19/2022   CHOL 210 (H) 09/09/2022   TRIG 57.0 09/09/2022   HDL 68.00 09/09/2022   LDLDIRECT 131.9 10/26/2013   LDLCALC 131 (H) 09/09/2022   ALT 11 12/19/2022   AST 15 12/19/2022   NA 135 12/19/2022   K 4.2 12/19/2022   CL 99 12/19/2022   CREATININE 0.74 12/19/2022   BUN 9 12/19/2022   CO2 29 12/19/2022   TSH 0.95 12/19/2022    CT CARDIAC SCORING (SELF PAY ONLY)  Addendum Date: 12/31/2022   ADDENDUM REPORT: 12/31/2022 10:02 CLINICAL DATA:  Risk stratification EXAM: Coronary Calcium Score TECHNIQUE: The patient was scanned on a Siemens Somatom go.Top Scanner. Axial non-contrast 3 mm slices were carried out through the heart. The data set was analyzed on a dedicated work station and scored using the Agatson method. FINDINGS: Non-cardiac: See separate report  from [facility]. Ascending Aorta: Normal size Pericardium: Normal Coronary arteries: Normal origin of left and right coronary arteries. Distribution of arterial calcifications if present, as noted below; LM 0 LAD 0 LCx 0 RCA 0 Total 0 IMPRESSION AND RECOMMENDATION: 1. Normal coronary calcium score of 0.  Patient  is low risk. 2. CAC 0, CAC-DRS A0. 3. Continue heart healthy lifestyle and risk factor modification. Electronically Signed   By: Debbe Odea M.D.   On: 12/31/2022 10:02   Result Date: 12/31/2022 EXAM: OVER-READ INTERPRETATION  CT CHEST The following report is an over-read performed by radiologist Dr. Irish Lack of [facility] on 12/31/2022. This over-read does not include interpretation of cardiac or coronary anatomy or pathology. The coronary calcium score interpretation by the cardiologist is attached. COMPARISON:  None Available. FINDINGS: Vascular: There is atherosclerosis of the visualized thoracic aorta. Mediastinum/Nodes: Visualized mediastinum and hilar regions demonstrate no lymphadenopathy or masses. Lungs/Pleura: Visualized lungs show no evidence of pulmonary edema, consolidation, pneumothorax, nodule or pleural fluid. Upper Abdomen: No acute abnormality. Musculoskeletal: No chest wall mass or suspicious bone lesions identified. IMPRESSION: Atherosclerosis of the thoracic aorta. Aortic Atherosclerosis (ICD10-I70.0). Electronically Signed: By: Irish Lack M.D. On: 12/31/2022 09:36       Assessment & Plan:  Hypercholesterolemia Assessment & Plan: Low cholesterol diet and exercise.  Follow lipid panel.   Orders: -     Basic metabolic panel; Future -     Hepatic function panel; Future -     Lipid panel; Future  Health care maintenance Assessment & Plan: Physical today 03/18/23.  PAP 03/07/22 - negative with negative HPV (atrophy). Mammogram 08/21/22 - Birads I.  Colonoscopy 01/2021.  Need results.    Screening for HIV without presence of risk  factors -     HIV Antibody (routine testing w rflx); Future  Visit for screening mammogram -     3D Screening Mammogram, Left and Right; Future  History of colonic polyps Assessment & Plan: Had colonoscopy 01/2021.    Melanoma of skin Assessment & Plan: Followed by dermatology.     Meningioma Assessment & Plan: S/p removal of parietal tumor.  Followed by Dr Zachery Conch.  Evaluated 12/11/21.  MRI - maybe slight increase, but overall felt stable.  Recommended f/u in 2 years.    Closed nondisplaced fracture of right patella with routine healing, unspecified fracture morphology, subsequent encounter Assessment & Plan: [facility] - s/p ORIF right patella fracture 05/17/22 (Dr Okey Dupre).  PT.  Doing exercises as outlined.       Dale Sherwood, MD

## 2023-03-18 NOTE — Patient Instructions (Signed)
Your Medicare Annual Wellness Visit is due. Please schedule this at check out. 

## 2023-03-23 ENCOUNTER — Encounter: Payer: Self-pay | Admitting: Internal Medicine

## 2023-03-23 NOTE — Assessment & Plan Note (Signed)
Low cholesterol diet and exercise.  Follow lipid panel.   

## 2023-03-23 NOTE — Assessment & Plan Note (Signed)
Followed by dermatology

## 2023-03-23 NOTE — Assessment & Plan Note (Signed)
S/p removal of parietal tumor.  Followed by Dr Friedman.  Evaluated 12/11/21.  MRI - maybe slight increase, but overall felt stable.  Recommended f/u in 2 years.  ?

## 2023-03-23 NOTE — Assessment & Plan Note (Signed)
[place] - s/p ORIF right patella fracture 05/17/22 (Dr Okey Dupre).  PT.  Doing exercises as outlined.

## 2023-03-23 NOTE — Assessment & Plan Note (Signed)
Had colonoscopy 01/2021.

## 2023-03-25 DIAGNOSIS — H40033 Anatomical narrow angle, bilateral: Secondary | ICD-10-CM | POA: Diagnosis not present

## 2023-05-19 ENCOUNTER — Other Ambulatory Visit: Payer: BC Managed Care – PPO

## 2023-05-28 DIAGNOSIS — M170 Bilateral primary osteoarthritis of knee: Secondary | ICD-10-CM | POA: Diagnosis not present

## 2023-07-25 DIAGNOSIS — H40033 Anatomical narrow angle, bilateral: Secondary | ICD-10-CM | POA: Diagnosis not present

## 2023-07-25 DIAGNOSIS — H2513 Age-related nuclear cataract, bilateral: Secondary | ICD-10-CM | POA: Diagnosis not present

## 2023-08-04 DIAGNOSIS — E785 Hyperlipidemia, unspecified: Secondary | ICD-10-CM | POA: Diagnosis not present

## 2023-08-04 DIAGNOSIS — Z791 Long term (current) use of non-steroidal anti-inflammatories (NSAID): Secondary | ICD-10-CM | POA: Diagnosis not present

## 2023-08-04 DIAGNOSIS — I10 Essential (primary) hypertension: Secondary | ICD-10-CM | POA: Diagnosis not present

## 2023-08-04 DIAGNOSIS — I951 Orthostatic hypotension: Secondary | ICD-10-CM | POA: Diagnosis not present

## 2023-08-04 DIAGNOSIS — Z973 Presence of spectacles and contact lenses: Secondary | ICD-10-CM | POA: Diagnosis not present

## 2023-08-04 DIAGNOSIS — M1990 Unspecified osteoarthritis, unspecified site: Secondary | ICD-10-CM | POA: Diagnosis not present

## 2023-08-04 DIAGNOSIS — Z85828 Personal history of other malignant neoplasm of skin: Secondary | ICD-10-CM | POA: Diagnosis not present

## 2023-08-04 DIAGNOSIS — H269 Unspecified cataract: Secondary | ICD-10-CM | POA: Diagnosis not present

## 2023-08-06 ENCOUNTER — Telehealth: Payer: Self-pay | Admitting: Internal Medicine

## 2023-08-06 NOTE — Telephone Encounter (Signed)
Copied from CRM [id number]. Topic: Medicare AWV >> Aug 06, 2023 10:00 AM Payton Doughty wrote: Reason for CRM: LM 08/06/2023 to schedule AWV   Verlee Rossetti; Care Guide Ambulatory Clinical Support [place] [facility] Direct Dial: [phone]

## 2023-08-08 DIAGNOSIS — H40061 Primary angle closure without glaucoma damage, right eye: Secondary | ICD-10-CM | POA: Diagnosis not present

## 2023-08-08 DIAGNOSIS — Z01818 Encounter for other preprocedural examination: Secondary | ICD-10-CM | POA: Diagnosis not present

## 2023-08-08 DIAGNOSIS — H40062 Primary angle closure without glaucoma damage, left eye: Secondary | ICD-10-CM | POA: Diagnosis not present

## 2023-08-11 ENCOUNTER — Other Ambulatory Visit: Payer: BC Managed Care – PPO

## 2023-08-13 ENCOUNTER — Ambulatory Visit: Payer: BC Managed Care – PPO

## 2023-08-20 ENCOUNTER — Ambulatory Visit: Payer: BC Managed Care – PPO

## 2023-08-26 ENCOUNTER — Ambulatory Visit (INDEPENDENT_AMBULATORY_CARE_PROVIDER_SITE_OTHER): Payer: Medicare HMO | Admitting: *Deleted

## 2023-08-26 VITALS — BP 118/74 | HR 70 | Temp 97.1°F | Ht 65.0 in | Wt 148.0 lb

## 2023-08-26 DIAGNOSIS — Z0000 Encounter for general adult medical examination without abnormal findings: Secondary | ICD-10-CM | POA: Diagnosis not present

## 2023-08-26 DIAGNOSIS — Z780 Asymptomatic menopausal state: Secondary | ICD-10-CM | POA: Diagnosis not present

## 2023-08-26 NOTE — Patient Instructions (Signed)
Ms. Ida , Thank you for taking time to come for your Medicare Wellness Visit. I appreciate your ongoing commitment to your health goals. Please review the following plan we discussed and let me know if I can assist you in the future.   Referrals/Orders/Follow-Ups/Clinician Recommendations: Bone Density/Dexa  This is a list of the screening recommended for you and due dates:  Health Maintenance  Topic Date Due   Zoster (Shingles) Vaccine (2 of 2) 10/22/2017   Flu Shot  07/17/2023   COVID-19 Vaccine (4 - 2023-24 season) 08/17/2023   Mammogram  08/22/2023   Pneumonia Vaccine (1 of 1 - PCV) 12/20/2023*   Medicare Annual Wellness Visit  08/25/2024   DTaP/Tdap/Td vaccine (2 - Td or Tdap) 08/28/2027   Colon Cancer Screening  02/01/2031   DEXA scan (bone density measurement)  Completed   Hepatitis C Screening  Completed   HPV Vaccine  Aged Out  *Topic was postponed. The date shown is not the original due date.    Advanced directives: (Provided) Advance directive discussed with you today. I have provided a copy for you to complete at home and have notarized. Once this is complete, please bring a copy in to our office so we can scan it into your chart.   Next Medicare Annual Wellness Visit scheduled for next year: Yes 08/31/24 @ 3:00

## 2023-08-26 NOTE — Progress Notes (Signed)
Subjective:   Carla Horne is a 67 y.o. female who presents for an Initial Medicare Annual Wellness Visit.  Visit Complete: In person  Vital signs documented  Review of Systems     Cardiac Risk Factors include: advanced age (>18men, >92 women);dyslipidemia     Objective:    Today's Vitals   08/26/23 1537  BP: 118/74  Pulse: 70  Temp: (!) 97.1 F (36.2 C)  TempSrc: Skin  SpO2: 96%  Weight: 148 lb (67.1 kg)  Height: 5\' 5"  (1.651 m)   Body mass index is 24.63 kg/m.     08/26/2023    3:55 PM 04/03/2021   11:08 AM 03/31/2021    5:26 PM  Advanced Directives  Does Patient Have a Medical Advance Directive? No No No  Would patient like information on creating a medical advance directive? Yes (MAU/Ambulatory/Procedural Areas - Information given) No - Patient declined No - Patient declined    Current Medications (verified) Outpatient Encounter Medications as of 08/26/2023  Medication Sig   NON FORMULARY Vitamin Code Raw calcium , take four by mouth daily   Omega-3 Fatty Acids (FISH OIL) 1000 MG CAPS Take by mouth. Take one by mouth daily   Red Yeast Rice Extract (RED YEAST RICE PO) Take by mouth. Take two by mouth daily   TURMERIC-GINGER PO Take by mouth. Take by mouth two times a day   No facility-administered encounter medications on file as of 08/26/2023.    Allergies (verified) Prednisone   History: Past Medical History:  Diagnosis Date   Cancer (HCC)    melanoma- leg   Meningioma (HCC)    Past Surgical History:  Procedure Laterality Date   Bladder Mesh Implant..1990     BREAST BIOPSY Left 20+ yrs ago   core - neg   BREAST SURGERY  1990   breast biopsy   KNEE SURGERY Right 06/2022   ORIF WRIST FRACTURE Left 04/03/2021   Procedure: OPEN REDUCTION INTERNAL FIXATION (ORIF) WRIST FRACTURE, left distal radius;  Surgeon: Christena Flake, MD;  Location: [facility] ORS;  Service: Orthopedics;  Laterality: Left;   Remval of mengionoma  2010   Family History  Problem  Relation Age of Onset   Hypercholesterolemia Father    Hypertension Father    Breast cancer Paternal Grandmother    Colon cancer Neg Hx    Social History   Socioeconomic History   Marital status: Married    Spouse name: Not on file   Number of children: 2   Years of education: Not on file   Highest education level: Not on file  Occupational History   Occupation: retired Runner, broadcasting/film/video  Tobacco Use   Smoking status: Never   Smokeless tobacco: Never  Substance and Sexual Activity   Alcohol use: No    Alcohol/week: 0.0 standard drinks of alcohol   Drug use: No   Sexual activity: Not on file    Comment: LMP 2010  Other Topics Concern   Not on file  Social History Narrative   Married   Works part time   Social Determinants of Corporate investment banker Strain: Low Risk  (08/26/2023)   Overall Financial Resource Strain (CARDIA)    Difficulty of Paying Living Expenses: Not hard at all  Food Insecurity: No Food Insecurity (08/26/2023)   Hunger Vital Sign    Worried About Running Out of Food in the Last Year: Never true    Ran Out of Food in the Last Year: Never true  Transportation  Needs: No Transportation Needs (08/26/2023)   PRAPARE - Administrator, Civil Service (Medical): No    Lack of Transportation (Non-Medical): No  Physical Activity: Sufficiently Active (08/26/2023)   Exercise Vital Sign    Days of Exercise per Week: 5 days    Minutes of Exercise per Session: 90 min  Stress: No Stress Concern Present (08/26/2023)   [employer] of Occupational Health - Occupational Stress Questionnaire    Feeling of Stress : Not at all  Social Connections: Moderately Integrated (08/26/2023)   Social Connection and Isolation Panel [NHANES]    Frequency of Communication with Friends and Family: More than three times a week    Frequency of Social Gatherings with Friends and Family: More than three times a week    Attends Religious Services: More than 4 times per year     Active Member of [employer] or Organizations: No    Attends Engineer, structural: Never    Marital Status: Married    Tobacco Counseling Counseling given: Not Answered   Clinical Intake:  Pre-visit preparation completed: Yes  Pain : No/denies pain     BMI - recorded: 24.63 Nutritional Status: BMI of 19-24  Normal Nutritional Risks: None Diabetes: No  How often do you need to have someone help you when you read instructions, pamphlets, or other written materials from your doctor or pharmacy?: 1 - Never  Interpreter Needed?: No  Information entered by :: R. Latonyia Lopata LPN   Activities of Daily Living    08/26/2023    3:43 PM  In your present state of health, do you have any difficulty performing the following activities:  Hearing? 0  Vision? 0  Comment glasses, has cataracts  Difficulty concentrating or making decisions? 0  Walking or climbing stairs? 0  Dressing or bathing? 0  Doing errands, shopping? 0  Preparing Food and eating ? N  Using the Toilet? N  In the past six months, have you accidently leaked urine? N  Do you have problems with loss of bowel control? N  Managing your Medications? N  Managing your Finances? N  Housekeeping or managing your Housekeeping? N    Patient Care Team: Dale Plainview, MD as PCP - General (Internal Medicine)  Indicate any recent Medical Services you may have received from other than [facility] providers in the past year (date may be approximate).     Assessment:   This is a routine wellness examination for Carla Horne.  Hearing/Vision screen Hearing Screening - Comments:: No issues Vision Screening - Comments:: Glasses, has cataracts   Goals Addressed             This Visit's Progress    Patient Stated       Wants to lose weight and lower her cholesterol       Depression Screen    08/26/2023    3:51 PM 12/19/2022    1:29 PM 12/13/2022   11:35 AM 03/07/2022   10:22 AM 08/29/2020    4:24 PM 11/01/2019    3:13 PM  07/14/2017    3:38 PM  PHQ 2/9 Scores  PHQ - 2 Score 0 0 0 0 0 0 0  PHQ- 9 Score 0  2    0    Fall Risk    08/26/2023    3:44 PM 12/19/2022    1:29 PM 12/13/2022   11:35 AM 03/07/2022   10:22 AM 08/29/2020    4:24 PM  Fall Risk   Falls in the  past year? 0 0 0 0 0  Number falls in past yr: 0 0 0  0  Injury with Fall? 0 0 0  0  Risk for fall due to : No Fall Risks No Fall Risks No Fall Risks No Fall Risks   Follow up Falls prevention discussed;Falls evaluation completed Falls evaluation completed Falls evaluation completed Falls evaluation completed Falls evaluation completed    MEDICARE RISK AT HOME: Medicare Risk at Home Any stairs in or around the home?: No If so, are there any without handrails?: No Home free of loose throw rugs in walkways, pet beds, electrical cords, etc?: Yes Adequate lighting in your home to reduce risk of falls?: Yes Life alert?: No Use of a cane, walker or w/c?: No Grab bars in the bathroom?: No Shower chair or bench in shower?: No Elevated toilet seat or a handicapped toilet?: Yes  TIMED UP AND GO:  Was the test performed? Yes  Length of time to ambulate 10 feet: 8 sec Gait steady and fast without use of assistive device    Cognitive Function:        08/26/2023    3:56 PM  6CIT Screen  What Year? 0 points  What month? 0 points  What time? 0 points  Count back from 20 0 points  Months in reverse 0 points  Repeat phrase 0 points  Total Score 0 points    Immunizations Immunization History  Administered Date(s) Administered   Influenza,inj,Quad PF,6+ Mos 09/10/2019   Influenza-Unspecified 10/23/2016, 08/27/2017, 01/22/2018, 09/15/2018   PFIZER(Purple Top)SARS-COV-2 Vaccination 08/31/2020, 09/24/2020   PPD Test 09/14/2018   Tdap 08/27/2017   Zoster Recombinant(Shingrix) 08/27/2017    TDAP status: Up to date  Flu Vaccine status: Declined, Education has been provided regarding the importance of this vaccine but patient still declined.  Advised may receive this vaccine at local pharmacy or Health Dept. Aware to provide a copy of the vaccination record if obtained from local pharmacy or Health Dept. Verbalized acceptance and understanding.  Pneumococcal vaccine status: Due, Education has been provided regarding the importance of this vaccine. Advised may receive this vaccine at local pharmacy or Health Dept. Aware to provide a copy of the vaccination record if obtained from local pharmacy or Health Dept. Verbalized acceptance and understanding.  Covid-19 vaccine status: Declined, Education has been provided regarding the importance of this vaccine but patient still declined. Advised may receive this vaccine at local pharmacy or Health Dept.or vaccine clinic. Aware to provide a copy of the vaccination record if obtained from local pharmacy or Health Dept. Verbalized acceptance and understanding.  Qualifies for Shingles Vaccine? Yes   Zostavax completed No   Shingrix Completed?: Yes  Patient will get a record of the second one  Screening Tests Health Maintenance  Topic Date Due   Medicare Annual Wellness (AWV)  Never done   Zoster Vaccines- Shingrix (2 of 2) 10/22/2017   INFLUENZA VACCINE  07/17/2023   COVID-19 Vaccine (4 - 2023-24 season) 08/17/2023   MAMMOGRAM  08/22/2023   Pneumonia Vaccine 68+ Years old (1 of 1 - PCV) 12/20/2023 (Originally 06/05/2022)   DTaP/Tdap/Td (2 - Td or Tdap) 08/28/2027   Colonoscopy  02/01/2031   DEXA SCAN  Completed   Hepatitis C Screening  Completed   HPV VACCINES  Aged Out    Health Maintenance  Health Maintenance Due  Topic Date Due   Medicare Annual Wellness (AWV)  Never done   Zoster Vaccines- Shingrix (2 of 2) 10/22/2017   INFLUENZA  VACCINE  07/17/2023   COVID-19 Vaccine (4 - 2023-24 season) 08/17/2023   MAMMOGRAM  08/22/2023    Colorectal cancer screening: Type of screening: Colonoscopy. Completed 2/22. Repeat every 10 years  Mammogram status: Completed 9/23. Repeat every year  Order placed  Bone Density status: Completed 10/17. Results reflect: Bone density results: OSTEOPENIA. Repeat every 2 years. Order placed  Lung Cancer Screening: (Low Dose CT Chest recommended if Age 11-80 years, 20 pack-year currently smoking OR have quit w/in 15years.) does not qualify.     Additional Screening:  Hepatitis C Screening: does qualify; Completed 7/18  Vision Screening: Recommended annual ophthalmology exams for early detection of glaucoma and other disorders of the eye. Is the patient up to date with their annual eye exam?  Yes  Who is the provider or what is the name of the office in which the patient attends annual eye exams? [facility] If pt is not established with a provider, would they like to be referred to a provider to establish care? No .   Dental Screening: Recommended annual dental exams for proper oral hygiene    Community Resource Referral / Chronic Care Management: CRR required this visit?  No   CCM required this visit?  No     Plan:     I have personally reviewed and noted the following in the patient's chart:   Medical and social history Use of alcohol, tobacco or illicit drugs  Current medications and supplements including opioid prescriptions. Patient is not currently taking opioid prescriptions. Functional ability and status Nutritional status Physical activity Advanced directives List of other physicians Hospitalizations, surgeries, and ER visits in previous 12 months Vitals Screenings to include cognitive, depression, and falls Referrals and appointments  In addition, I have reviewed and discussed with patient certain preventive protocols, quality metrics, and best practice recommendations. A written personalized care plan for preventive services as well as general preventive health recommendations were provided to patient.     Sydell Axon, LPN   03/06/253   After Visit Summary: (Pick Up) Due to this being a telephonic visit,  with patients personalized plan was offered to patient and patient has requested to Pick up at office.  Nurse Notes: None

## 2023-09-29 ENCOUNTER — Other Ambulatory Visit (INDEPENDENT_AMBULATORY_CARE_PROVIDER_SITE_OTHER): Payer: Medicare HMO

## 2023-09-29 DIAGNOSIS — Z114 Encounter for screening for human immunodeficiency virus [HIV]: Secondary | ICD-10-CM | POA: Diagnosis not present

## 2023-09-29 DIAGNOSIS — E7800 Pure hypercholesterolemia, unspecified: Secondary | ICD-10-CM | POA: Diagnosis not present

## 2023-09-29 LAB — BASIC METABOLIC PANEL
BUN: 13 mg/dL (ref 6–23)
CO2: 29 meq/L (ref 19–32)
Calcium: 9.5 mg/dL (ref 8.4–10.5)
Chloride: 104 meq/L (ref 96–112)
Creatinine, Ser: 0.85 mg/dL (ref 0.40–1.20)
GFR: 71.45 mL/min (ref >60.00)
Glucose, Bld: 82 mg/dL (ref 70–99)
Potassium: 4.2 meq/L (ref 3.5–5.1)
Sodium: 140 meq/L (ref 135–145)

## 2023-09-29 LAB — LIPID PANEL
Cholesterol: 235 mg/dL — ABNORMAL HIGH (ref 0–200)
HDL: 65.6 mg/dL (ref >39.00)
LDL Cholesterol: 156 mg/dL — ABNORMAL HIGH (ref 0–99)
NonHDL: 169.59
Total CHOL/HDL Ratio: 4
Triglycerides: 66 mg/dL (ref 0.0–149.0)
VLDL: 13.2 mg/dL (ref 0.0–40.0)

## 2023-09-29 LAB — HEPATIC FUNCTION PANEL
ALT: 17 U/L (ref 0–35)
AST: 19 U/L (ref 0–37)
Albumin: 4.3 g/dL (ref 3.5–5.2)
Alkaline Phosphatase: 75 U/L (ref 39–117)
Bilirubin, Direct: 0.1 mg/dL (ref 0.0–0.3)
Total Bilirubin: 1 mg/dL (ref 0.2–1.2)
Total Protein: 6.6 g/dL (ref 6.0–8.3)

## 2023-09-30 LAB — HIV ANTIBODY (ROUTINE TESTING W REFLEX): HIV 1&2 Ab, 4th Generation: NONREACTIVE

## 2023-10-06 DIAGNOSIS — H2512 Age-related nuclear cataract, left eye: Secondary | ICD-10-CM | POA: Diagnosis not present

## 2023-10-06 DIAGNOSIS — H25812 Combined forms of age-related cataract, left eye: Secondary | ICD-10-CM | POA: Diagnosis not present

## 2023-10-06 DIAGNOSIS — H269 Unspecified cataract: Secondary | ICD-10-CM | POA: Diagnosis not present

## 2023-10-20 DIAGNOSIS — H25811 Combined forms of age-related cataract, right eye: Secondary | ICD-10-CM | POA: Diagnosis not present

## 2023-10-20 DIAGNOSIS — H269 Unspecified cataract: Secondary | ICD-10-CM | POA: Diagnosis not present

## 2023-10-20 DIAGNOSIS — H2511 Age-related nuclear cataract, right eye: Secondary | ICD-10-CM | POA: Diagnosis not present

## 2023-11-06 ENCOUNTER — Ambulatory Visit
Admission: RE | Admit: 2023-11-06 | Discharge: 2023-11-06 | Disposition: A | Discharge status: Home / Self Care | Payer: Medicare HMO | Source: Ambulatory Visit | Attending: Internal Medicine | Admitting: Internal Medicine

## 2023-11-06 DIAGNOSIS — Z780 Asymptomatic menopausal state: Secondary | ICD-10-CM | POA: Diagnosis not present

## 2023-11-06 DIAGNOSIS — Z1231 Encounter for screening mammogram for malignant neoplasm of breast: Secondary | ICD-10-CM | POA: Diagnosis not present

## 2023-11-07 ENCOUNTER — Telehealth: Payer: Self-pay

## 2023-11-07 NOTE — Telephone Encounter (Signed)
-----   Message from [place] sent at 11/07/2023  4:41 AM EST ----- Please call and notify - bone density reveals osteoporosis.  Please schedule and appt to discuss treatment options.

## 2023-11-07 NOTE — Telephone Encounter (Signed)
Patient returned office phone call. Note was read and appointment scheduled.

## 2023-11-07 NOTE — Telephone Encounter (Signed)
Noted  

## 2023-11-10 DIAGNOSIS — Z789 Other specified health status: Secondary | ICD-10-CM | POA: Diagnosis not present

## 2023-11-10 DIAGNOSIS — Z6824 Body mass index (BMI) 24.0-24.9, adult: Secondary | ICD-10-CM | POA: Diagnosis not present

## 2023-11-10 DIAGNOSIS — N3001 Acute cystitis with hematuria: Secondary | ICD-10-CM | POA: Diagnosis not present

## 2023-11-12 DIAGNOSIS — M13862 Other specified arthritis, left knee: Secondary | ICD-10-CM | POA: Diagnosis not present

## 2023-11-20 ENCOUNTER — Ambulatory Visit: Payer: Medicare HMO | Admitting: Internal Medicine

## 2023-11-20 ENCOUNTER — Encounter: Payer: Self-pay | Admitting: Internal Medicine

## 2023-11-20 VITALS — BP 118/70 | HR 68 | Temp 98.2°F | Resp 16 | Ht 65.0 in | Wt 150.0 lb

## 2023-11-20 DIAGNOSIS — M810 Age-related osteoporosis without current pathological fracture: Secondary | ICD-10-CM | POA: Insufficient documentation

## 2023-11-20 DIAGNOSIS — E7800 Pure hypercholesterolemia, unspecified: Secondary | ICD-10-CM | POA: Diagnosis not present

## 2023-11-20 MED ORDER — ALENDRONATE SODIUM 70 MG PO TABS: 70.0000 mg | ORAL_TABLET | ORAL | 11 refills | Status: DC

## 2023-11-20 NOTE — Progress Notes (Signed)
Subjective:    Patient ID: Carla Horne, female    DOB: 1956-07-01, 67 y.o.   MRN: [id number]  Patient here for  Chief Complaint  Patient presents with   Osteoporosis    HPI Here to discuss recent bone density and treatment options.  Recent bone density - osteoporosis (-3.2 AP spine). Discussed bone density results.  Discussed taking calcium, vitamin D and weight bearing exercise.  Discussed treatment options, including evista, oral bisphosphonates and IV reclast and prolia.  Discussed possible side effects of medication.  No acid reflux.  No swallowing problems.  Is having left knee pain.  Has seen ortho.  Bone on bone.  Has tried cortisone injections and gel injections - not help.  Currently wants to monitor.  Does not limit her current activity. Still exercising regularly.    Past Medical History:  Diagnosis Date   Cancer (HCC)    melanoma- leg   Meningioma (HCC)    Past Surgical History:  Procedure Laterality Date   Bladder Mesh Implant..1990     BREAST BIOPSY Left 20+ yrs ago   core - neg   BREAST SURGERY  1990   breast biopsy   KNEE SURGERY Right 06/2022   ORIF WRIST FRACTURE Left 04/03/2021   Procedure: OPEN REDUCTION INTERNAL FIXATION (ORIF) WRIST FRACTURE, left distal radius;  Surgeon: Christena Flake, MD;  Location: [facility] ORS;  Service: Orthopedics;  Laterality: Left;   Remval of mengionoma  2010   Family History  Problem Relation Age of Onset   Hypercholesterolemia Father    Hypertension Father    Breast cancer Paternal Grandmother    Colon cancer Neg Hx    Social History   Socioeconomic History   Marital status: Married    Spouse name: Not on file   Number of children: 2   Years of education: Not on file   Highest education level: Not on file  Occupational History   Occupation: retired Runner, broadcasting/film/video  Tobacco Use   Smoking status: Never   Smokeless tobacco: Never  Substance and Sexual Activity   Alcohol use: No    Alcohol/week: 0.0 standard drinks of alcohol    Drug use: No   Sexual activity: Not on file    Comment: LMP 2010  Other Topics Concern   Not on file  Social History Narrative   Married   Works part time   Social Determinants of Corporate investment banker Strain: Low Risk  (08/26/2023)   Overall Financial Resource Strain (CARDIA)    Difficulty of Paying Living Expenses: Not hard at all  Food Insecurity: No Food Insecurity (08/26/2023)   Hunger Vital Sign    Worried About Running Out of Food in the Last Year: Never true    Ran Out of Food in the Last Year: Never true  Transportation Needs: No Transportation Needs (08/26/2023)   PRAPARE - Administrator, Civil Service (Medical): No    Lack of Transportation (Non-Medical): No  Physical Activity: Sufficiently Active (08/26/2023)   Exercise Vital Sign    Days of Exercise per Week: 5 days    Minutes of Exercise per Session: 90 min  Stress: No Stress Concern Present (08/26/2023)   [employer] of Occupational Health - Occupational Stress Questionnaire    Feeling of Stress : Not at all  Social Connections: Moderately Integrated (08/26/2023)   Social Connection and Isolation Panel [NHANES]    Frequency of Communication with Friends and Family: More than three times a week  Frequency of Social Gatherings with Friends and Family: More than three times a week    Attends Religious Services: More than 4 times per year    Active Member of [employer] or Organizations: No    Attends Banker Meetings: Never    Marital Status: Married     Review of Systems  Constitutional:  Negative for appetite change and unexpected weight change.  HENT:  Negative for congestion and sinus pressure.   Respiratory:  Negative for cough, chest tightness and shortness of breath.   Cardiovascular:  Negative for chest pain, palpitations and leg swelling.  Gastrointestinal:  Negative for abdominal pain, diarrhea, nausea and vomiting.  Genitourinary:  Negative for difficulty urinating and  dysuria.  Musculoskeletal:  Negative for joint swelling and myalgias.  Skin:  Negative for color change and rash.  Neurological:  Negative for dizziness and headaches.  Psychiatric/Behavioral:  Negative for agitation and dysphoric mood.        Increased stress - mother and husband's medical issues.        Objective:     BP 118/70   Pulse 68   Temp 98.2 F (36.8 C)   Resp 16   Ht 5\' 5"  (1.651 m)   Wt 150 lb (68 kg)   SpO2 98%   BMI 24.96 kg/m  Wt Readings from Last 3 Encounters:  11/20/23 150 lb (68 kg)  08/26/23 148 lb (67.1 kg)  03/18/23 150 lb (68 kg)    Physical Exam Vitals reviewed.  Constitutional:      General: She is not in acute distress.    Appearance: Normal appearance.  HENT:     Head: Normocephalic and atraumatic.     Right Ear: External ear normal.     Left Ear: External ear normal.  Eyes:     General: No scleral icterus.       Right eye: No discharge.        Left eye: No discharge.     Conjunctiva/sclera: Conjunctivae normal.  Neck:     Thyroid: No thyromegaly.  Cardiovascular:     Rate and Rhythm: Normal rate and regular rhythm.  Pulmonary:     Effort: No respiratory distress.     Breath sounds: Normal breath sounds. No wheezing.  Abdominal:     General: Bowel sounds are normal.     Palpations: Abdomen is soft.     Tenderness: There is no abdominal tenderness.  Musculoskeletal:        General: No swelling or tenderness.     Cervical back: Neck supple. No tenderness.  Lymphadenopathy:     Cervical: No cervical adenopathy.  Skin:    Findings: No erythema or rash.  Neurological:     Mental Status: She is alert.  Psychiatric:        Mood and Affect: Mood normal.        Behavior: Behavior normal.      Outpatient Encounter Medications as of 11/20/2023  Medication Sig   alendronate (FOSAMAX) 70 MG tablet Take 1 tablet (70 mg total) by mouth every 7 (seven) days. Take with a full glass of water on an empty stomach.   NON FORMULARY Vitamin  Code Raw calcium , take four by mouth daily   Omega-3 Fatty Acids (FISH OIL) 1000 MG CAPS Take by mouth. Take one by mouth daily   Red Yeast Rice Extract (RED YEAST RICE PO) Take by mouth. Take two by mouth daily   TURMERIC-GINGER PO Take by mouth. Take by  mouth two times a day   No facility-administered encounter medications on file as of 11/20/2023.     Lab Results  Component Value Date   WBC 5.9 12/19/2022   HGB 14.9 12/19/2022   HCT 44.1 12/19/2022   PLT 289.0 12/19/2022   GLUCOSE 82 09/29/2023   CHOL 235 (H) 09/29/2023   TRIG 66.0 09/29/2023   HDL 65.60 09/29/2023   LDLDIRECT 131.9 10/26/2013   LDLCALC 156 (H) 09/29/2023   ALT 17 09/29/2023   AST 19 09/29/2023   NA 140 09/29/2023   K 4.2 09/29/2023   CL 104 09/29/2023   CREATININE 0.85 09/29/2023   BUN 13 09/29/2023   CO2 29 09/29/2023   TSH 0.95 12/19/2022    MM 3D SCREENING MAMMOGRAM BILATERAL BREAST  Result Date: 11/10/2023 CLINICAL DATA:  Screening. EXAM: DIGITAL SCREENING BILATERAL MAMMOGRAM WITH TOMOSYNTHESIS AND CAD TECHNIQUE: Bilateral screening digital craniocaudal and mediolateral oblique mammograms were obtained. Bilateral screening digital breast tomosynthesis was performed. The images were evaluated with computer-aided detection. COMPARISON:  Previous exam(s). ACR Breast Density Category b: There are scattered areas of fibroglandular density. FINDINGS: There are no findings suspicious for malignancy. IMPRESSION: No mammographic evidence of malignancy. A result letter of this screening mammogram will be mailed directly to the patient. RECOMMENDATION: Screening mammogram in one year. (Code:SM-B-01Y) BI-RADS CATEGORY  1: Negative. Electronically Signed   By: Baird Lyons M.D.   On: 11/10/2023 11:31   DG Bone Density  Result Date: 11/06/2023 EXAM: DUAL X-RAY ABSORPTIOMETRY (DXA) FOR BONE MINERAL DENSITY IMPRESSION: Your patient Corynn Gorsky completed a BMD test on 11/06/2023 using the [employer] iDXA DXA System (software  version: 14.10) manufactured by [employer]. The following summarizes the results of our evaluation. Technologist: [facility] PATIENT BIOGRAPHICAL: Name: Tyleisha, Shah Patient ID: [id number] Birth Date: [birth year] Height: 65.0 in. Gender: Female Exam Date: 11/06/2023 Weight: 151.4 lbs. Indications: Caucasian, Postmenopausal, History of Fracture (Adult) Fractures: Patella, Wrist Left Treatments: Calcium DENSITOMETRY RESULTS: Site      Region     Measured Date Measured Age WHO Classification Young Adult T-score BMD         %Change vs. Previous Significant Change (*) AP Spine L1-L4 11/06/2023 66.4 Osteoporosis -3.2 0.800 g/cm2 -12.2% Yes AP Spine L1-L4 10/02/2016 59.3 Osteopenia -2.3 0.911 g/cm2 - - DualFemur Total Left 11/06/2023 66.4 Osteopenia -2.4 0.711 g/cm2 -2.1% - DualFemur Total Left 10/02/2016 59.3 Osteopenia -2.2 0.726 g/cm2 - - DualFemur Total Mean 11/06/2023 66.4 Osteopenia -2.3 0.714 g/cm2 -2.9% Yes DualFemur Total Mean 10/02/2016 59.3 Osteopenia -2.2 0.735 g/cm2 - - ASSESSMENT: The BMD measured at AP Spine L1-L4 is 0.800 g/cm2 with a T-score of -3.2. This patient is considered osteoporotic according to World Health Organization [facility]) criteria. The scan quality is good. Compared with prior study, there has been significant decrease in the spine. Compared with prior study, there has been significant decrease in the total hip. World Science writer [facility]) criteria for post-menopausal, Caucasian Women: Normal:                   T-score at or above -1 SD Osteopenia/low bone mass: T-score between -1 and -2.5 SD Osteoporosis:             T-score at or below -2.5 SD RECOMMENDATIONS: 1. All patients should optimize calcium and vitamin D intake. 2. Consider FDA-approved medical therapies in postmenopausal women and men aged 50 years and older, based on the following: a. A hip or vertebral(clinical or morphometric) fracture b. T-score < -2.5  at the femoral neck or spine after appropriate evaluation to  exclude secondary causes c. Low bone mass (T-score between -1.0 and -2.5 at the femoral neck or spine) and a 10-year probability of a hip fracture > 3% or a 10-year probability of a major osteoporosis-related fracture > 20% based on the [place]-adapted WHO algorithm 3. Clinician judgment and/or patient preferences may indicate treatment for people with 10-year fracture probabilities above or below these levels FOLLOW-UP: People with diagnosed cases of osteoporosis or at high risk for fracture should have regular bone mineral density tests. For patients eligible for Medicare, routine testing is allowed once every 2 years. The testing frequency can be increased to one year for patients who have rapidly progressing disease, those who are receiving or discontinuing medical therapy to restore bone mass, or have additional risk factors. I have reviewed this report, and agree with the above findings. [facility] Electronically Signed   By: Romona Curls M.D.   On: 11/06/2023 14:03       Assessment & Plan:  Osteoporosis, unspecified osteoporosis type, unspecified pathological fracture presence Assessment & Plan: Recent bone density - osteoporosis (-3.2 AP spine). Discussed bone density results.  Discussed taking calcium, vitamin D and weight bearing exercise.  Discussed treatment options, including evista, oral bisphosphonates and IV reclast and prolia.  Discussed possible side effects of medication.  No acid reflux.  No swallowing problems. Will start fosamax. Discussed proper way to take the medication.  Check vitamin  D level with next labs.   Orders: -     VITAMIN D 25 Hydroxy (Vit-D Deficiency, Fractures); Future  Hypercholesterolemia Assessment & Plan: Low cholesterol diet and exercise.  Follow lipid panel.   Orders: -     CBC with Differential/Platelet; Future -     Basic metabolic panel; Future -     Lipid panel; Future -     Hepatic function panel; Future -     TSH; Future  Other  orders -     Alendronate Sodium; Take 1 tablet (70 mg total) by mouth every 7 (seven) days. Take with a full glass of water on an empty stomach.  Dispense: 4 tablet; Refill: 11     Dale Lyford, MD

## 2023-11-20 NOTE — Assessment & Plan Note (Signed)
Recent bone density - osteoporosis (-3.2 AP spine). Discussed bone density results.  Discussed taking calcium, vitamin D and weight bearing exercise.  Discussed treatment options, including evista, oral bisphosphonates and IV reclast and prolia.  Discussed possible side effects of medication.  No acid reflux.  No swallowing problems. Will start fosamax. Discussed proper way to take the medication.  Check vitamin  D level with next labs.

## 2023-11-20 NOTE — Assessment & Plan Note (Signed)
Low cholesterol diet and exercise.  Follow lipid panel.   

## 2024-02-20 DIAGNOSIS — Z789 Other specified health status: Secondary | ICD-10-CM | POA: Diagnosis not present

## 2024-02-20 DIAGNOSIS — Z6824 Body mass index (BMI) 24.0-24.9, adult: Secondary | ICD-10-CM | POA: Diagnosis not present

## 2024-02-20 DIAGNOSIS — J00 Acute nasopharyngitis [common cold]: Secondary | ICD-10-CM | POA: Diagnosis not present

## 2024-02-25 DIAGNOSIS — R0982 Postnasal drip: Secondary | ICD-10-CM | POA: Diagnosis not present

## 2024-02-25 DIAGNOSIS — J019 Acute sinusitis, unspecified: Secondary | ICD-10-CM | POA: Diagnosis not present

## 2024-02-25 DIAGNOSIS — R059 Cough, unspecified: Secondary | ICD-10-CM | POA: Diagnosis not present

## 2024-03-06 DIAGNOSIS — M26621 Arthralgia of right temporomandibular joint: Secondary | ICD-10-CM | POA: Diagnosis not present

## 2024-03-12 DIAGNOSIS — M170 Bilateral primary osteoarthritis of knee: Secondary | ICD-10-CM | POA: Diagnosis not present

## 2024-03-17 ENCOUNTER — Other Ambulatory Visit (INDEPENDENT_AMBULATORY_CARE_PROVIDER_SITE_OTHER): Payer: BC Managed Care – PPO

## 2024-03-17 DIAGNOSIS — E7800 Pure hypercholesterolemia, unspecified: Secondary | ICD-10-CM | POA: Diagnosis not present

## 2024-03-17 DIAGNOSIS — M810 Age-related osteoporosis without current pathological fracture: Secondary | ICD-10-CM

## 2024-03-17 LAB — BASIC METABOLIC PANEL WITH GFR
BUN: 17 mg/dL (ref 6–23)
CO2: 27 meq/L (ref 19–32)
Calcium: 9.3 mg/dL (ref 8.4–10.5)
Chloride: 104 meq/L (ref 96–112)
Creatinine, Ser: 0.8 mg/dL (ref 0.40–1.20)
GFR: 76.59 mL/min (ref >60.00)
Glucose, Bld: 91 mg/dL (ref 70–99)
Potassium: 3.9 meq/L (ref 3.5–5.1)
Sodium: 138 meq/L (ref 135–145)

## 2024-03-17 LAB — LIPID PANEL
Cholesterol: 190 mg/dL (ref 0–200)
HDL: 64.5 mg/dL (ref >39.00)
LDL Cholesterol: 115 mg/dL — ABNORMAL HIGH (ref 0–99)
NonHDL: 125.52
Total CHOL/HDL Ratio: 3
Triglycerides: 51 mg/dL (ref 0.0–149.0)
VLDL: 10.2 mg/dL (ref 0.0–40.0)

## 2024-03-17 LAB — CBC WITH DIFFERENTIAL/PLATELET
Basophils Absolute: 0 10*3/uL (ref 0.0–0.1)
Basophils Relative: 0.3 % (ref 0.0–3.0)
Eosinophils Absolute: 0.2 10*3/uL (ref 0.0–0.7)
Eosinophils Relative: 2 % (ref 0.0–5.0)
HCT: 42.9 % (ref 36.0–46.0)
Hemoglobin: 14.4 g/dL (ref 12.0–15.0)
Lymphocytes Relative: 13.1 % (ref 12.0–46.0)
Lymphs Abs: 1 10*3/uL (ref 0.7–4.0)
MCHC: 33.6 g/dL (ref 30.0–36.0)
MCV: 97.7 fl (ref 78.0–100.0)
Monocytes Absolute: 0.7 10*3/uL (ref 0.1–1.0)
Monocytes Relative: 9.4 % (ref 3.0–12.0)
Neutro Abs: 6 10*3/uL (ref 1.4–7.7)
Neutrophils Relative %: 75.2 % (ref 43.0–77.0)
Platelets: 257 10*3/uL (ref 150.0–400.0)
RBC: 4.4 Mil/uL (ref 3.87–5.11)
RDW: 13.8 % (ref 11.5–15.5)
WBC: 7.9 10*3/uL (ref 4.0–10.5)

## 2024-03-17 LAB — HEPATIC FUNCTION PANEL
ALT: 14 U/L (ref 0–35)
AST: 15 U/L (ref 0–37)
Albumin: 4.4 g/dL (ref 3.5–5.2)
Alkaline Phosphatase: 62 U/L (ref 39–117)
Bilirubin, Direct: 0.2 mg/dL (ref 0.0–0.3)
Total Bilirubin: 0.9 mg/dL (ref 0.2–1.2)
Total Protein: 6.8 g/dL (ref 6.0–8.3)

## 2024-03-17 LAB — TSH: TSH: 0.6 u[IU]/mL (ref 0.35–5.50)

## 2024-03-17 LAB — VITAMIN D 25 HYDROXY (VIT D DEFICIENCY, FRACTURES): VITD: 36.98 ng/mL (ref 30.00–100.00)

## 2024-03-19 ENCOUNTER — Ambulatory Visit (INDEPENDENT_AMBULATORY_CARE_PROVIDER_SITE_OTHER): Payer: BC Managed Care – PPO | Admitting: Internal Medicine

## 2024-03-19 ENCOUNTER — Ambulatory Visit
Admission: RE | Admit: 2024-03-19 | Discharge: 2024-03-19 | Disposition: A | Discharge status: Home / Self Care | Attending: Internal Medicine | Admitting: Internal Medicine

## 2024-03-19 ENCOUNTER — Ambulatory Visit
Admission: RE | Admit: 2024-03-19 | Discharge: 2024-03-19 | Disposition: A | Discharge status: Home / Self Care | Source: Ambulatory Visit | Attending: Internal Medicine | Admitting: Internal Medicine

## 2024-03-19 ENCOUNTER — Encounter: Payer: Self-pay | Admitting: Internal Medicine

## 2024-03-19 VITALS — BP 120/68 | HR 69 | Temp 98.2°F | Resp 16 | Ht 65.0 in | Wt 150.6 lb

## 2024-03-19 DIAGNOSIS — R0989 Other specified symptoms and signs involving the circulatory and respiratory systems: Secondary | ICD-10-CM | POA: Diagnosis not present

## 2024-03-19 DIAGNOSIS — D329 Benign neoplasm of meninges, unspecified: Secondary | ICD-10-CM | POA: Diagnosis not present

## 2024-03-19 DIAGNOSIS — Z0000 Encounter for general adult medical examination without abnormal findings: Secondary | ICD-10-CM | POA: Diagnosis not present

## 2024-03-19 DIAGNOSIS — Z85820 Personal history of malignant melanoma of skin: Secondary | ICD-10-CM

## 2024-03-19 DIAGNOSIS — M810 Age-related osteoporosis without current pathological fracture: Secondary | ICD-10-CM | POA: Diagnosis not present

## 2024-03-19 DIAGNOSIS — R233 Spontaneous ecchymoses: Secondary | ICD-10-CM | POA: Diagnosis not present

## 2024-03-19 DIAGNOSIS — R059 Cough, unspecified: Secondary | ICD-10-CM

## 2024-03-19 DIAGNOSIS — E7800 Pure hypercholesterolemia, unspecified: Secondary | ICD-10-CM

## 2024-03-19 DIAGNOSIS — M25562 Pain in left knee: Secondary | ICD-10-CM

## 2024-03-19 NOTE — Progress Notes (Signed)
 Subjective:    Patient ID: Carla Horne, female    DOB: Dec 01, 1956, 68 y.o.   MRN: [id number]  Patient here for  Chief Complaint  Patient presents with   Annual Exam    HPI Here for a physical exam.  Has been having left knee pain. Has seen ortho. Bone on bone. Has pain in both knees. Injection helped right. Some relief in left. Wants to hold on further intervention at this time. Currently wants to monitor. Recently started fosamax. Tolerating. Taking calcium and vitamin D. Stays active. Exercises. No chest pain. Was seen 02/25/24 - for sinus infection. Treated with augmentin. Symptoms improved. Noticed one week ago, increased drainage, cough and chest congestion. No sob. No chest pain or tightness. No vomiting or diarrhea. Reports easy bruising.    Past Medical History:  Diagnosis Date   Cancer (HCC)    melanoma- leg   Meningioma (HCC)    Past Surgical History:  Procedure Laterality Date   Bladder Mesh Implant..1990     BREAST BIOPSY Left 20+ yrs ago   core - neg   BREAST SURGERY  1990   breast biopsy   KNEE SURGERY Right 06/2022   ORIF WRIST FRACTURE Left 04/03/2021   Procedure: OPEN REDUCTION INTERNAL FIXATION (ORIF) WRIST FRACTURE, left distal radius;  Surgeon: Christena Flake, MD;  Location: [facility] ORS;  Service: Orthopedics;  Laterality: Left;   Remval of mengionoma  2010   Family History  Problem Relation Age of Onset   Hypercholesterolemia Father    Hypertension Father    Breast cancer Paternal Grandmother    Colon cancer Neg Hx    Social History   Socioeconomic History   Marital status: Married    Spouse name: Not on file   Number of children: 2   Years of education: Not on file   Highest education level: Not on file  Occupational History   Occupation: retired Runner, broadcasting/film/video  Tobacco Use   Smoking status: Never   Smokeless tobacco: Never  Substance and Sexual Activity   Alcohol use: No    Alcohol/week: 0.0 standard drinks of alcohol   Drug use: No   Sexual  activity: Not on file    Comment: LMP 2010  Other Topics Concern   Not on file  Social History Narrative   Married   Works part time   Social Drivers of Corporate investment banker Strain: Low Risk  (08/26/2023)   Overall Financial Resource Strain (CARDIA)    Difficulty of Paying Living Expenses: Not hard at all  Food Insecurity: No Food Insecurity (08/26/2023)   Hunger Vital Sign    Worried About Running Out of Food in the Last Year: Never true    Ran Out of Food in the Last Year: Never true  Transportation Needs: No Transportation Needs (08/26/2023)   PRAPARE - Administrator, Civil Service (Medical): No    Lack of Transportation (Non-Medical): No  Physical Activity: Sufficiently Active (08/26/2023)   Exercise Vital Sign    Days of Exercise per Week: 5 days    Minutes of Exercise per Session: 90 min  Stress: No Stress Concern Present (08/26/2023)   [employer] of Occupational Health - Occupational Stress Questionnaire    Feeling of Stress : Not at all  Social Connections: Moderately Integrated (08/26/2023)   Social Connection and Isolation Panel [NHANES]    Frequency of Communication with Friends and Family: More than three times a week    Frequency of Social  Gatherings with Friends and Family: More than three times a week    Attends Religious Services: More than 4 times per year    Active Member of Clubs or Organizations: No    Attends Banker Meetings: Never    Marital Status: Married     Review of Systems  Constitutional:  Negative for appetite change and unexpected weight change.  HENT:  Positive for congestion and postnasal drip. Negative for sinus pressure and sore throat.   Eyes:  Negative for pain and visual disturbance.  Respiratory:  Positive for cough. Negative for chest tightness and shortness of breath.   Cardiovascular:  Negative for chest pain, palpitations and leg swelling.  Gastrointestinal:  Negative for abdominal pain,  diarrhea, nausea and vomiting.  Genitourinary:  Negative for difficulty urinating and dysuria.  Musculoskeletal:  Negative for joint swelling and myalgias.  Skin:  Negative for color change and rash.  Neurological:  Negative for dizziness and headaches.  Hematological:  Negative for adenopathy. Does not bruise/bleed easily.  Psychiatric/Behavioral:  Negative for agitation and dysphoric mood.        Objective:     BP 120/68   Pulse 69   Temp 98.2 F (36.8 C)   Resp 16   Ht 5\' 5"  (1.651 m)   Wt 150 lb 9.6 oz (68.3 kg)   SpO2 98%   BMI 25.06 kg/m  Wt Readings from Last 3 Encounters:  03/19/24 150 lb 9.6 oz (68.3 kg)  11/20/23 150 lb (68 kg)  08/26/23 148 lb (67.1 kg)    Physical Exam Vitals reviewed.  Constitutional:      General: She is not in acute distress.    Appearance: Normal appearance. She is well-developed.  HENT:     Head: Normocephalic and atraumatic.     Right Ear: External ear normal.     Left Ear: External ear normal.     Mouth/Throat:     Pharynx: No oropharyngeal exudate or posterior oropharyngeal erythema.  Eyes:     General: No scleral icterus.       Right eye: No discharge.        Left eye: No discharge.     Conjunctiva/sclera: Conjunctivae normal.  Neck:     Thyroid: No thyromegaly.  Cardiovascular:     Rate and Rhythm: Normal rate and regular rhythm.  Pulmonary:     Effort: No tachypnea, accessory muscle usage or respiratory distress.     Breath sounds: Normal breath sounds. No decreased breath sounds or wheezing.  Chest:  Breasts:    Right: No inverted nipple, mass, nipple discharge or tenderness (no axillary adenopathy).     Left: No inverted nipple, mass, nipple discharge or tenderness (no axilarry adenopathy).  Abdominal:     General: Bowel sounds are normal.     Palpations: Abdomen is soft.     Tenderness: There is no abdominal tenderness.  Musculoskeletal:        General: No swelling or tenderness.     Cervical back: Neck supple.   Lymphadenopathy:     Cervical: No cervical adenopathy.  Skin:    Findings: No erythema or rash.  Neurological:     Mental Status: She is alert and oriented to person, place, and time.  Psychiatric:        Mood and Affect: Mood normal.        Behavior: Behavior normal.         Outpatient Encounter Medications as of 03/19/2024  Medication Sig   alendronate (  FOSAMAX) 70 MG tablet Take 1 tablet (70 mg total) by mouth every 7 (seven) days. Take with a full glass of water on an empty stomach.   NON FORMULARY Vitamin Code Raw calcium , take four by mouth daily   Omega-3 Fatty Acids (FISH OIL) 1000 MG CAPS Take by mouth. Take one by mouth daily   [DISCONTINUED] Red Yeast Rice Extract (RED YEAST RICE PO) Take by mouth. Take two by mouth daily   [DISCONTINUED] TURMERIC-GINGER PO Take by mouth. Take by mouth two times a day   No facility-administered encounter medications on file as of 03/19/2024.     Lab Results  Component Value Date   WBC 7.9 03/17/2024   HGB 14.4 03/17/2024   HCT 42.9 03/17/2024   PLT 257.0 03/17/2024   GLUCOSE 91 03/17/2024   CHOL 190 03/17/2024   TRIG 51.0 03/17/2024   HDL 64.50 03/17/2024   LDLDIRECT 131.9 10/26/2013   LDLCALC 115 (H) 03/17/2024   ALT 14 03/17/2024   AST 15 03/17/2024   NA 138 03/17/2024   K 3.9 03/17/2024   CL 104 03/17/2024   CREATININE 0.80 03/17/2024   BUN 17 03/17/2024   CO2 27 03/17/2024   TSH 0.60 03/17/2024    MM 3D SCREENING MAMMOGRAM BILATERAL BREAST Result Date: 11/10/2023 CLINICAL DATA:  Screening. EXAM: DIGITAL SCREENING BILATERAL MAMMOGRAM WITH TOMOSYNTHESIS AND CAD TECHNIQUE: Bilateral screening digital craniocaudal and mediolateral oblique mammograms were obtained. Bilateral screening digital breast tomosynthesis was performed. The images were evaluated with computer-aided detection. COMPARISON:  Previous exam(s). ACR Breast Density Category b: There are scattered areas of fibroglandular density. FINDINGS: There are no  findings suspicious for malignancy. IMPRESSION: No mammographic evidence of malignancy. A result letter of this screening mammogram will be mailed directly to the patient. RECOMMENDATION: Screening mammogram in one year. (Code:SM-B-01Y) BI-RADS CATEGORY  1: Negative. Electronically Signed   By: Baird Lyons M.D.   On: 11/10/2023 11:31   DG Bone Density Result Date: 11/06/2023 EXAM: DUAL X-RAY ABSORPTIOMETRY (DXA) FOR BONE MINERAL DENSITY IMPRESSION: Your patient Akiera Allbaugh completed a BMD test on 11/06/2023 using the [employer] iDXA DXA System (software version: 14.10) manufactured by [employer]. The following summarizes the results of our evaluation. Technologist: [facility] PATIENT BIOGRAPHICAL: Name: Colandra, Ohanian Patient ID: [id number] Birth Date: 1956/11/25 Height: 65.0 in. Gender: Female Exam Date: 11/06/2023 Weight: 151.4 lbs. Indications: Caucasian, Postmenopausal, History of Fracture (Adult) Fractures: Patella, Wrist Left Treatments: Calcium DENSITOMETRY RESULTS: Site      Region     Measured Date Measured Age WHO Classification Young Adult T-score BMD         %Change vs. Previous Significant Change (*) AP Spine L1-L4 11/06/2023 66.4 Osteoporosis -3.2 0.800 g/cm2 -12.2% Yes AP Spine L1-L4 10/02/2016 59.3 Osteopenia -2.3 0.911 g/cm2 - - DualFemur Total Left 11/06/2023 66.4 Osteopenia -2.4 0.711 g/cm2 -2.1% - DualFemur Total Left 10/02/2016 59.3 Osteopenia -2.2 0.726 g/cm2 - - DualFemur Total Mean 11/06/2023 66.4 Osteopenia -2.3 0.714 g/cm2 -2.9% Yes DualFemur Total Mean 10/02/2016 59.3 Osteopenia -2.2 0.735 g/cm2 - - ASSESSMENT: The BMD measured at AP Spine L1-L4 is 0.800 g/cm2 with a T-score of -3.2. This patient is considered osteoporotic according to World Health Organization [facility]) criteria. The scan quality is good. Compared with prior study, there has been significant decrease in the spine. Compared with prior study, there has been significant decrease in the total hip. World Health Organization  [facility]) criteria for post-menopausal, Caucasian Women: Normal:  T-score at or above -1 SD Osteopenia/low bone mass: T-score between -1 and -2.5 SD Osteoporosis:             T-score at or below -2.5 SD RECOMMENDATIONS: 1. All patients should optimize calcium and vitamin D intake. 2. Consider FDA-approved medical therapies in postmenopausal women and men aged 47 years and older, based on the following: a. A hip or vertebral(clinical or morphometric) fracture b. T-score < -2.5 at the femoral neck or spine after appropriate evaluation to exclude secondary causes c. Low bone mass (T-score between -1.0 and -2.5 at the femoral neck or spine) and a 10-year probability of a hip fracture > 3% or a 10-year probability of a major osteoporosis-related fracture > 20% based on the [place]-adapted WHO algorithm 3. Clinician judgment and/or patient preferences may indicate treatment for people with 10-year fracture probabilities above or below these levels FOLLOW-UP: People with diagnosed cases of osteoporosis or at high risk for fracture should have regular bone mineral density tests. For patients eligible for Medicare, routine testing is allowed once every 2 years. The testing frequency can be increased to one year for patients who have rapidly progressing disease, those who are receiving or discontinuing medical therapy to restore bone mass, or have additional risk factors. I have reviewed this report, and agree with the above findings. [facility] Electronically Signed   By: Romona Curls M.D.   On: 11/06/2023 14:03       Assessment & Plan:  Health care maintenance Assessment & Plan: Physical today 03/19/24.  PAP 03/07/22 - negative with negative HPV (atrophy). Mammogram 11/06/23 - Birads I.  Colonoscopy 01/2021 - internal hemorrhoids and diverticulosis. Recommended f/u in 10 years.    Hypercholesterolemia Assessment & Plan: The 10-year ASCVD risk score (Arnett DK, et al., 2019) is: 5%   Values  used to calculate the score:     Age: 16 years     Sex: Female     Is Non-Hispanic African American: No     Diabetic: No     Tobacco smoker: No     Systolic Blood Pressure: 120 mmHg     Is BP treated: No     HDL Cholesterol: 64.5 mg/dL     Total Cholesterol: 190 mg/dL  Low cholesterol diet and exercise. Follow lipid panel.   Orders: -     Basic metabolic panel with GFR; Future -     Lipid panel; Future -     Hepatic function panel; Future  Cough, unspecified type Assessment & Plan: Recent cough and congestion as outlined. Treated for sinus infection in March. Some drainage and cough. Discussed robitussin DM. Follow. Check cxr.   Orders: -     DG Chest 2 View; Future  History of melanoma Assessment & Plan: Followed by dermatology. Check cxr today.   Orders: -     DG Chest 2 View; Future  Easy bruising Assessment & Plan: Discussed thinning skin. Recent cbc, including platelet count wnl.    Left knee pain, unspecified chronicity Assessment & Plan: Has been having left knee pain. Has seen ortho. Bone on bone. Has pain in both knees. Injection helped right. Some relief in left. Wants to hold on further intervention at this time.    Meningioma [facility]) Assessment & Plan: S/p removal of parietal tumor.  Followed by Dr Zachery Conch.  Evaluated 12/11/21.  MRI - maybe slight increase, but overall felt stable.  Recommended f/u in 2 years. Need to confirm f/u.    Osteoporosis, unspecified  osteoporosis type, unspecified pathological fracture presence Assessment & Plan: Continue fosamax, calcium and vitamin D and weight bearing exercise. Follow.       Dale Bethesda, MD

## 2024-03-19 NOTE — Assessment & Plan Note (Signed)
 Physical today 03/19/24.  PAP 03/07/22 - negative with negative HPV (atrophy). Mammogram 11/06/23 - Birads I.  Colonoscopy 01/2021 - internal hemorrhoids and diverticulosis. Recommended f/u in 10 years.

## 2024-03-19 NOTE — Patient Instructions (Signed)
 Robitussin DM

## 2024-03-19 NOTE — Assessment & Plan Note (Addendum)
 The 10-year ASCVD risk score (Arnett DK, et al., 2019) is: 5%   Values used to calculate the score:     Age: 68 years     Sex: Female     Is Non-Hispanic African American: No     Diabetic: No     Tobacco smoker: No     Systolic Blood Pressure: 120 mmHg     Is BP treated: No     HDL Cholesterol: 64.5 mg/dL     Total Cholesterol: 190 mg/dL  Low cholesterol diet and exercise. Follow lipid panel.

## 2024-03-22 ENCOUNTER — Encounter: Payer: Self-pay | Admitting: Internal Medicine

## 2024-03-22 ENCOUNTER — Telehealth: Payer: Self-pay | Admitting: Internal Medicine

## 2024-03-22 NOTE — Assessment & Plan Note (Signed)
 Continue fosamax, calcium and vitamin D and weight bearing exercise. Follow.

## 2024-03-22 NOTE — Assessment & Plan Note (Signed)
 Discussed thinning skin. Recent cbc, including platelet count wnl.

## 2024-03-22 NOTE — Telephone Encounter (Signed)
 Please call Carla Horne and notify her that I was reviewing her chart. She has seen Dr Zachery Conch for f/u meningioma. He last saw her 11/2021. He recommended 2 year f/u then. This would have put her due in 11/2023. See if agreeable for referral back to Dr Zachery Conch.

## 2024-03-22 NOTE — Assessment & Plan Note (Signed)
 Has been having left knee pain. Has seen ortho. Bone on bone. Has pain in both knees. Injection helped right. Some relief in left. Wants to hold on further intervention at this time.

## 2024-03-22 NOTE — Assessment & Plan Note (Signed)
 Recent cough and congestion as outlined. Treated for sinus infection in March. Some drainage and cough. Discussed robitussin DM. Follow. Check cxr.

## 2024-03-22 NOTE — Assessment & Plan Note (Signed)
 Followed by dermatology. Check cxr today.

## 2024-03-22 NOTE — Telephone Encounter (Signed)
 Patient is aware of below and says no referral needed- she will set up appointment.

## 2024-03-22 NOTE — Assessment & Plan Note (Signed)
 S/p removal of parietal tumor.  Followed by Dr Zachery Conch.  Evaluated 12/11/21.  MRI - maybe slight increase, but overall felt stable.  Recommended f/u in 2 years. Need to confirm f/u.

## 2024-03-28 DIAGNOSIS — N3001 Acute cystitis with hematuria: Secondary | ICD-10-CM | POA: Diagnosis not present

## 2024-03-28 DIAGNOSIS — R030 Elevated blood-pressure reading, without diagnosis of hypertension: Secondary | ICD-10-CM | POA: Diagnosis not present

## 2024-04-04 ENCOUNTER — Encounter: Payer: Self-pay | Admitting: Internal Medicine

## 2024-04-04 DIAGNOSIS — R300 Dysuria: Secondary | ICD-10-CM

## 2024-04-05 NOTE — Telephone Encounter (Signed)
 Carla Horne, please call her and let her know that I do not mind checking a urine, but if she is having persistent symptoms after taking abx, I do feel she needs to be seen.

## 2024-04-05 NOTE — Telephone Encounter (Signed)
 Ok to schedule a f/u appt with me to discuss and can recheck urine if needed. Confirm if any vaginal symptoms.

## 2024-04-05 NOTE — Telephone Encounter (Unsigned)
 Copied from CRM [id number]. Topic: General - Other >> Apr 05, 2024  2:07 PM Martinique E wrote: Reason for CRM: Patient called returning a missed call from [place]. Relayed the message to patient that PCP put in chart and patient was questioning if it is okay just to have her urine re-checked without having an office visit. Patient stated she can have a visit if something is found when she gets her urine re-checked. Patient finishes her anitbiotic on Wednesday 4/23, so hoping to have a repeat test after that. Callback number for patient is [phone].

## 2024-04-05 NOTE — Telephone Encounter (Signed)
 LMTCB

## 2024-04-06 ENCOUNTER — Other Ambulatory Visit: Payer: Self-pay

## 2024-04-06 DIAGNOSIS — R300 Dysuria: Secondary | ICD-10-CM

## 2024-04-06 NOTE — Telephone Encounter (Signed)
 Patient states her symptoms are improving but she would still like to get her urine checked. Patient is scheduled for a lab appointment on 04/08/24 at 11:00.

## 2024-04-06 NOTE — Telephone Encounter (Signed)
Order placed for urinalysis and culture

## 2024-04-07 NOTE — Addendum Note (Signed)
 Addended by: Victorino Grates D on: 04/07/2024 07:05 AM   Modules accepted: Orders

## 2024-04-08 ENCOUNTER — Encounter: Payer: Self-pay | Admitting: Internal Medicine

## 2024-04-08 ENCOUNTER — Other Ambulatory Visit (INDEPENDENT_AMBULATORY_CARE_PROVIDER_SITE_OTHER)

## 2024-04-08 DIAGNOSIS — R300 Dysuria: Secondary | ICD-10-CM

## 2024-04-08 LAB — URINALYSIS, ROUTINE W REFLEX MICROSCOPIC
Bilirubin Urine: NEGATIVE
Hgb urine dipstick: NEGATIVE
Ketones, ur: NEGATIVE
Leukocytes,Ua: NEGATIVE
Nitrite: NEGATIVE
RBC / HPF: NONE SEEN (ref 0–?)
Specific Gravity, Urine: 1.01 (ref 1.000–1.030)
Total Protein, Urine: NEGATIVE
Urine Glucose: NEGATIVE
Urobilinogen, UA: 0.2 (ref 0.0–1.0)
WBC, UA: NONE SEEN (ref 0–?)
pH: 7 (ref 5.0–8.0)

## 2024-04-08 NOTE — Telephone Encounter (Signed)
 Reviewed urinalysis. The urine does appear clear. The urine culture is pending. If she is able, we can wait and see what the urine culture shows - just to confirm no infection present. Also, please confirm current symptoms she is having.

## 2024-04-09 LAB — URINE CULTURE
MICRO NUMBER:: 16370959
Result:: NO GROWTH
SPECIMEN QUALITY:: ADEQUATE

## 2024-04-16 ENCOUNTER — Ambulatory Visit: Admitting: Internal Medicine

## 2024-06-11 DIAGNOSIS — M1712 Unilateral primary osteoarthritis, left knee: Secondary | ICD-10-CM | POA: Diagnosis not present

## 2024-06-11 DIAGNOSIS — Z0189 Encounter for other specified special examinations: Secondary | ICD-10-CM | POA: Diagnosis not present

## 2024-07-26 DIAGNOSIS — D3610 Benign neoplasm of peripheral nerves and autonomic nervous system, unspecified: Secondary | ICD-10-CM | POA: Diagnosis not present

## 2024-07-26 DIAGNOSIS — L570 Actinic keratosis: Secondary | ICD-10-CM | POA: Diagnosis not present

## 2024-07-26 DIAGNOSIS — L578 Other skin changes due to chronic exposure to nonionizing radiation: Secondary | ICD-10-CM | POA: Diagnosis not present

## 2024-07-26 DIAGNOSIS — Z85820 Personal history of malignant melanoma of skin: Secondary | ICD-10-CM | POA: Diagnosis not present

## 2024-07-26 DIAGNOSIS — L821 Other seborrheic keratosis: Secondary | ICD-10-CM | POA: Diagnosis not present

## 2024-08-02 ENCOUNTER — Encounter: Payer: Self-pay | Admitting: Internal Medicine

## 2024-08-02 DIAGNOSIS — M25472 Effusion, left ankle: Secondary | ICD-10-CM | POA: Diagnosis not present

## 2024-08-02 DIAGNOSIS — M436 Torticollis: Secondary | ICD-10-CM | POA: Diagnosis not present

## 2024-08-02 DIAGNOSIS — S0990XA Unspecified injury of head, initial encounter: Secondary | ICD-10-CM | POA: Diagnosis not present

## 2024-08-02 DIAGNOSIS — M7989 Other specified soft tissue disorders: Secondary | ICD-10-CM | POA: Diagnosis not present

## 2024-08-02 DIAGNOSIS — E041 Nontoxic single thyroid nodule: Secondary | ICD-10-CM | POA: Diagnosis not present

## 2024-08-02 DIAGNOSIS — Z043 Encounter for examination and observation following other accident: Secondary | ICD-10-CM | POA: Diagnosis not present

## 2024-08-02 DIAGNOSIS — R600 Localized edema: Secondary | ICD-10-CM | POA: Diagnosis not present

## 2024-08-02 DIAGNOSIS — M810 Age-related osteoporosis without current pathological fracture: Secondary | ICD-10-CM | POA: Diagnosis not present

## 2024-08-02 DIAGNOSIS — Z79899 Other long term (current) drug therapy: Secondary | ICD-10-CM | POA: Diagnosis not present

## 2024-08-02 NOTE — Telephone Encounter (Signed)
 FYI Called and spoke with patient. Given that she fell and hit her head, concerned of injury- She is going to go to ED for evaluation to confirm nothing acute going on

## 2024-08-02 NOTE — Telephone Encounter (Signed)
 LM to follow up with patient.

## 2024-08-02 NOTE — Telephone Encounter (Signed)
 Agree with evaluation today if fell and hit her head.  Please confirm evaluated.

## 2024-08-03 NOTE — Telephone Encounter (Signed)
 See other note

## 2024-08-04 ENCOUNTER — Ambulatory Visit: Payer: Self-pay | Admitting: Internal Medicine

## 2024-08-04 ENCOUNTER — Encounter: Payer: Self-pay | Admitting: Internal Medicine

## 2024-08-04 DIAGNOSIS — E041 Nontoxic single thyroid nodule: Secondary | ICD-10-CM

## 2024-08-04 NOTE — Telephone Encounter (Signed)
 Copied from CRM #[id number]. Topic: General - Other >> Aug 04, 2024  1:16 PM Aleatha C wrote: Reason for CRM: Patient called to return Dr glendia nurse call about evaulation  I called patient and read message from Dr. Allena glendia.  Patient states she would like to know if there is a possibility that an issue from her fall could show up later, since it isn't showing right now on her scan.  Patient states she is ok with having an ultrasound of her thyroid .  I let patient know that they will call her to schedule the appointment for her ultrasound.

## 2024-08-05 NOTE — Telephone Encounter (Signed)
 See result note.

## 2024-08-09 NOTE — Telephone Encounter (Signed)
 Pt agreeable to thyroid  ultrasound. (Incidental finding on CT she just had done.)

## 2024-08-09 NOTE — Telephone Encounter (Signed)
 Order placed for thyroid  ultrasound. Please confirm no questions. Incidental finding on scan. Just need to evaluate thyroid 

## 2024-08-10 NOTE — Telephone Encounter (Signed)
Pt aware.  No further questions.

## 2024-08-12 ENCOUNTER — Ambulatory Visit
Admission: RE | Admit: 2024-08-12 | Discharge: 2024-08-12 | Disposition: A | Discharge status: Home / Self Care | Source: Ambulatory Visit | Attending: Internal Medicine | Admitting: Internal Medicine

## 2024-08-12 DIAGNOSIS — E041 Nontoxic single thyroid nodule: Secondary | ICD-10-CM | POA: Diagnosis not present

## 2024-08-17 ENCOUNTER — Encounter: Payer: Self-pay | Admitting: Internal Medicine

## 2024-08-17 ENCOUNTER — Ambulatory Visit: Payer: Self-pay | Admitting: Internal Medicine

## 2024-08-17 DIAGNOSIS — E041 Nontoxic single thyroid nodule: Secondary | ICD-10-CM

## 2024-08-17 NOTE — Telephone Encounter (Signed)
 Please see result note regarding thyroid  ultrasound and recommendation for referral to endocrinology. Please call pt - see result note.

## 2024-08-18 ENCOUNTER — Other Ambulatory Visit: Payer: Self-pay

## 2024-08-18 DIAGNOSIS — E041 Nontoxic single thyroid nodule: Secondary | ICD-10-CM

## 2024-08-18 NOTE — Telephone Encounter (Signed)
 NOTED

## 2024-08-18 NOTE — Telephone Encounter (Signed)
 Copied from CRM #[id number]. Topic: General - Other >> Aug 18, 2024  7:39 AM Deleta RAMAN wrote: Reason for CRM : calling to speak with Sueanne [phone].

## 2024-08-24 NOTE — Telephone Encounter (Signed)
 Copied from CRM [id number]. Topic: Referral - Question >> Aug 24, 2024 11:10 AM Robinson DEL wrote: Reason for CRM: Was referred to Endocrinologist at [facility] can't see her until December, wants to be referred t [facility] clinic [phone] fax they have openings in October.  Sherece [phone]

## 2024-08-25 NOTE — Telephone Encounter (Signed)
 I placed an order for a referral to [facility]. I spoke to her. She wants to see when appt can be scheduled at [facility]. Wants to keep the December appt currently.

## 2024-08-30 DIAGNOSIS — Z0189 Encounter for other specified special examinations: Secondary | ICD-10-CM | POA: Diagnosis not present

## 2024-08-31 ENCOUNTER — Ambulatory Visit: Payer: BC Managed Care – PPO

## 2024-09-01 ENCOUNTER — Ambulatory Visit

## 2024-09-06 DIAGNOSIS — E041 Nontoxic single thyroid nodule: Secondary | ICD-10-CM | POA: Diagnosis not present

## 2024-09-13 ENCOUNTER — Encounter: Payer: Self-pay | Admitting: Internal Medicine

## 2024-09-13 DIAGNOSIS — M170 Bilateral primary osteoarthritis of knee: Secondary | ICD-10-CM | POA: Insufficient documentation

## 2024-09-13 DIAGNOSIS — M1711 Unilateral primary osteoarthritis, right knee: Secondary | ICD-10-CM | POA: Diagnosis not present

## 2024-09-21 ENCOUNTER — Other Ambulatory Visit (INDEPENDENT_AMBULATORY_CARE_PROVIDER_SITE_OTHER)

## 2024-09-21 DIAGNOSIS — E7800 Pure hypercholesterolemia, unspecified: Secondary | ICD-10-CM

## 2024-09-21 LAB — LIPID PANEL
Cholesterol: 206 mg/dL — ABNORMAL HIGH (ref 0–200)
HDL: 63.7 mg/dL (ref >39.00)
LDL Cholesterol: 129 mg/dL — ABNORMAL HIGH (ref 0–99)
NonHDL: 142.24
Total CHOL/HDL Ratio: 3
Triglycerides: 66 mg/dL (ref 0.0–149.0)
VLDL: 13.2 mg/dL (ref 0.0–40.0)

## 2024-09-21 LAB — HEPATIC FUNCTION PANEL
ALT: 14 U/L (ref 0–35)
AST: 15 U/L (ref 0–37)
Albumin: 4.2 g/dL (ref 3.5–5.2)
Alkaline Phosphatase: 63 U/L (ref 39–117)
Bilirubin, Direct: 0.2 mg/dL (ref 0.0–0.3)
Total Bilirubin: 0.9 mg/dL (ref 0.2–1.2)
Total Protein: 6.5 g/dL (ref 6.0–8.3)

## 2024-09-21 LAB — BASIC METABOLIC PANEL WITH GFR
BUN: 13 mg/dL (ref 6–23)
CO2: 30 meq/L (ref 19–32)
Calcium: 9.8 mg/dL (ref 8.4–10.5)
Chloride: 103 meq/L (ref 96–112)
Creatinine, Ser: 0.81 mg/dL (ref 0.40–1.20)
GFR: 75.18 mL/min (ref >60.00)
Glucose, Bld: 90 mg/dL (ref 70–99)
Potassium: 4.1 meq/L (ref 3.5–5.1)
Sodium: 138 meq/L (ref 135–145)

## 2024-09-22 ENCOUNTER — Ambulatory Visit: Payer: Self-pay | Admitting: Internal Medicine

## 2024-09-22 DIAGNOSIS — E041 Nontoxic single thyroid nodule: Secondary | ICD-10-CM | POA: Diagnosis not present

## 2024-09-23 ENCOUNTER — Ambulatory Visit (INDEPENDENT_AMBULATORY_CARE_PROVIDER_SITE_OTHER): Admitting: Internal Medicine

## 2024-09-23 ENCOUNTER — Encounter: Payer: Self-pay | Admitting: Internal Medicine

## 2024-09-23 VITALS — BP 120/70 | HR 72 | Resp 16 | Ht 65.0 in | Wt 150.8 lb

## 2024-09-23 DIAGNOSIS — E7800 Pure hypercholesterolemia, unspecified: Secondary | ICD-10-CM | POA: Diagnosis not present

## 2024-09-23 DIAGNOSIS — Z860100 Personal history of colon polyps, unspecified: Secondary | ICD-10-CM

## 2024-09-23 DIAGNOSIS — E041 Nontoxic single thyroid nodule: Secondary | ICD-10-CM | POA: Diagnosis not present

## 2024-09-23 DIAGNOSIS — M25562 Pain in left knee: Secondary | ICD-10-CM

## 2024-09-23 DIAGNOSIS — Z1231 Encounter for screening mammogram for malignant neoplasm of breast: Secondary | ICD-10-CM

## 2024-09-23 DIAGNOSIS — D329 Benign neoplasm of meninges, unspecified: Secondary | ICD-10-CM

## 2024-09-23 DIAGNOSIS — M810 Age-related osteoporosis without current pathological fracture: Secondary | ICD-10-CM

## 2024-09-23 NOTE — Assessment & Plan Note (Signed)
 Found incidentally on recent CT as outlined. Saw endocrinology. Biopsy yesterday. Waiting for results.

## 2024-09-23 NOTE — Assessment & Plan Note (Signed)
Had colonoscopy 01/2021.

## 2024-09-23 NOTE — Progress Notes (Signed)
 Subjective:    Patient ID: Carla Horne, female    DOB: 1956/03/11, 68 y.o.   MRN: [id number]  Patient here for  Chief Complaint  Patient presents with   Medical Management of Chronic Issues    HPI Here for a scheduled follow up - follow up regarding osteoporosis and hypercholesterolemia. Currently on fosamax . She has been seeing ortho for left knee OA (severe). Recommended surgery - left knee. Received injection right knee 09/13/24. Injection helped. Was seen ED 08/02/24 - s/p fall. (Slipped on a wet floor). CT c-spine no acute fracture or acute abnormality. Did have 1.7 cm left thyroid  nodule. CT head - no acute intracranially abnormality. 3.2 cm calcified mass along the anterior inferior falx, likely a calcified meningioma. Recommend comparison with prior MRI brain if available to assess for interval change/growth  saw endocrinology 09/06/24 - recommended thyroid  biopsy. Biopsy performed yesterday. She stays active. No chest pain or sob reported. No abdominal pain or bowel change reported.    Past Medical History:  Diagnosis Date   Cancer (HCC)    melanoma- leg   Meningioma (HCC)    Past Surgical History:  Procedure Laterality Date   Bladder Mesh Implant..1990     BREAST BIOPSY Left 20+ yrs ago   core - neg   BREAST SURGERY  1990   breast biopsy   KNEE SURGERY Right 06/2022   ORIF WRIST FRACTURE Left 04/03/2021   Procedure: OPEN REDUCTION INTERNAL FIXATION (ORIF) WRIST FRACTURE, left distal radius;  Surgeon: Edie Norleen PARAS, MD;  Location: [facility] ORS;  Service: Orthopedics;  Laterality: Left;   Remval of mengionoma  2010   Family History  Problem Relation Age of Onset   Hypercholesterolemia Father    Hypertension Father    Breast cancer Paternal Grandmother    Colon cancer Neg Hx    Social History   Socioeconomic History   Marital status: Married    Spouse name: Not on file   Number of children: 2   Years of education: Not on file   Highest education level: Not on file   Occupational History   Occupation: retired Runner, broadcasting/film/video  Tobacco Use   Smoking status: Never   Smokeless tobacco: Never  Substance and Sexual Activity   Alcohol use: No    Alcohol/week: 0.0 standard drinks of alcohol   Drug use: No   Sexual activity: Not on file    Comment: LMP 2010  Other Topics Concern   Not on file  Social History Narrative   Married   Works part time   Social Drivers of Corporate investment banker Strain: Low Risk  (09/06/2024)   Received from [facility]   Overall Financial Resource Strain (CARDIA)    Difficulty of Paying Living Expenses: Not hard at all  Food Insecurity: No Food Insecurity (09/06/2024)   Received from [facility]   Hunger Vital Sign    Within the past 12 months, you worried that your food would run out before you got the money to buy more.: Never true    Within the past 12 months, the food you bought just didn't last and you didn't have money to get more.: Never true  Transportation Needs: No Transportation Needs (09/06/2024)   Received from [facility] - Transportation    In the past 12 months, has lack of transportation kept you from medical appointments or from getting medications?: No    Lack of Transportation (Non-Medical): No  Physical Activity: Sufficiently Active (08/26/2023)   Exercise Vital Sign    Days of Exercise per Week: 5 days    Minutes of Exercise per Session: 90 min  Stress: No Stress Concern Present (08/26/2023)   [employer] of Occupational Health - Occupational Stress Questionnaire    Feeling of Stress : Not at all  Social Connections: Moderately Integrated (08/26/2023)   Social Connection and Isolation Panel    Frequency of Communication with Friends and Family: More than three times a week    Frequency of Social Gatherings with Friends and Family: More than three times a week    Attends Religious Services: More than 4 times per year    Active Member  of [employer] or Organizations: No    Attends Banker Meetings: Never    Marital Status: Married     Review of Systems  Constitutional:  Negative for appetite change and unexpected weight change.  HENT:  Negative for congestion and sinus pressure.   Respiratory:  Negative for cough, chest tightness and shortness of breath.   Cardiovascular:  Negative for chest pain and palpitations.  Gastrointestinal:  Negative for abdominal pain, diarrhea, nausea and vomiting.  Genitourinary:  Negative for difficulty urinating and dysuria.  Musculoskeletal:  Negative for joint swelling and myalgias.  Skin:  Negative for color change and rash.  Neurological:  Negative for dizziness and headaches.  Psychiatric/Behavioral:  Negative for agitation and dysphoric mood.        Objective:     BP 120/70   Pulse 72   Resp 16   Ht 5' 5 (1.651 m)   Wt 150 lb 12.8 oz (68.4 kg)   SpO2 98%   BMI 25.09 kg/m  Wt Readings from Last 3 Encounters:  09/23/24 150 lb 12.8 oz (68.4 kg)  03/19/24 150 lb 9.6 oz (68.3 kg)  11/20/23 150 lb (68 kg)    Physical Exam Vitals reviewed.  Constitutional:      General: She is not in acute distress.    Appearance: Normal appearance.  HENT:     Head: Normocephalic and atraumatic.     Right Ear: External ear normal.     Left Ear: External ear normal.     Mouth/Throat:     Pharynx: No oropharyngeal exudate or posterior oropharyngeal erythema.  Eyes:     General: No scleral icterus.       Right eye: No discharge.        Left eye: No discharge.     Conjunctiva/sclera: Conjunctivae normal.  Neck:     Thyroid : No thyromegaly.  Cardiovascular:     Rate and Rhythm: Normal rate and regular rhythm.  Pulmonary:     Effort: No respiratory distress.     Breath sounds: Normal breath sounds. No wheezing.  Abdominal:     General: Bowel sounds are normal.     Palpations: Abdomen is soft.     Tenderness: There is no abdominal tenderness.  Musculoskeletal:         General: No swelling or tenderness.     Cervical back: Neck supple. No tenderness.  Lymphadenopathy:     Cervical: No cervical adenopathy.  Skin:    Findings: No erythema or rash.  Neurological:     Mental Status: She is alert.  Psychiatric:        Mood and Affect: Mood normal.        Behavior: Behavior normal.         Outpatient Encounter Medications as of 09/23/2024  Medication Sig   celecoxib (CELEBREX) 200 MG capsule Take 200 mg by mouth daily as needed.   alendronate  (FOSAMAX ) 70 MG tablet Take 1 tablet (70 mg total) by mouth every 7 (seven) days. Take with a full glass of water on an empty stomach.   NON FORMULARY Vitamin Code Raw calcium , take four by mouth daily   Omega-3 Fatty Acids (FISH OIL) 1000 MG CAPS Take by mouth. Take one by mouth daily   No facility-administered encounter medications on file as of 09/23/2024.     Lab Results  Component Value Date   WBC 7.9 03/17/2024   HGB 14.4 03/17/2024   HCT 42.9 03/17/2024   PLT 257.0 03/17/2024   GLUCOSE 90 09/21/2024   CHOL 206 (H) 09/21/2024   TRIG 66.0 09/21/2024   HDL 63.70 09/21/2024   LDLDIRECT 131.9 10/26/2013   LDLCALC 129 (H) 09/21/2024   ALT 14 09/21/2024   AST 15 09/21/2024   NA 138 09/21/2024   K 4.1 09/21/2024   CL 103 09/21/2024   CREATININE 0.81 09/21/2024   BUN 13 09/21/2024   CO2 30 09/21/2024   TSH 0.60 03/17/2024    US  THYROID  Result Date: 08/17/2024 CLINICAL DATA:  Thyroid  nodule EXAM: THYROID  ULTRASOUND TECHNIQUE: Ultrasound examination of the thyroid  gland and adjacent soft tissues was performed. COMPARISON:  None available. FINDINGS: Parenchymal Echotexture: Mildly heterogenous Isthmus: 0.2 cm Right lobe: 3.9 x 1.6 x 1.3 cm Left lobe: 4.1 x 1.5 x 1.4 cm _________________________________________________________ Estimated total number of nodules >/= 1 cm: 1 Number of spongiform nodules >/=  2 cm not described below (TR1): 0 Number of mixed cystic and solid nodules >/= 1.5 cm not described  below (TR2): 0 _________________________________________________________ Nodule # 1: Location: LEFT; inferior Maximum size: 2.0 cm; Other 2 dimensions: 1.2 x 1.7 cm Composition: solid/almost completely solid (2) Echogenicity: isoechoic (1) Shape: not taller-than-wide (0) Margins: smooth (0) Echogenic foci: punctate echogenic foci (3) ACR TI-RADS total points: 6. ACR TI-RADS risk category: TR4 (4-6 points). ACR TI-RADS recommendations: **Given size (>/= 1.5 cm) and appearance, fine needle aspiration of this moderately suspicious nodule should be considered based on TI-RADS criteria. _________________________________________________________ IMPRESSION: Nodule 1 (TI-RADS 4), measuring 2.0 cm, located in the inferior LEFT thyroid  lobe, meets criteria for FNA. The above is in keeping with the ACR TI-RADS recommendations - J Am Coll Radiol 2017;14:587-595. Electronically Signed   By: Aliene Lloyd M.D.   On: 08/17/2024 12:05       Assessment & Plan:  Hypercholesterolemia Assessment & Plan: The 10-year ASCVD risk score (Arnett DK, et al., 2019) is: 6.3%   Values used to calculate the score:     Age: 15 years     Clincally relevant sex: Female     Is Non-Hispanic African American: No     Diabetic: No     Tobacco smoker: No     Systolic Blood Pressure: 124 mmHg     Is BP treated: No     HDL Cholesterol: 63.7 mg/dL     Total Cholesterol: 206 mg/dL  Low cholesterol diet and exercise. Follow lipid panel.   Orders: -     Basic metabolic panel with GFR; Future -     Lipid panel; Future -     Hepatic function panel; Future -     CBC with Differential/Platelet; Future -     TSH; Future  Visit for screening mammogram -     3D Screening Mammogram, Left and Right; Future  Thyroid  nodule Assessment &  Plan: Found incidentally on recent CT as outlined. Saw endocrinology. Biopsy yesterday. Waiting for results.    Osteoporosis, unspecified osteoporosis type, unspecified pathological fracture  presence Assessment & Plan: Continue fosamax , calcium and vitamin D  and weight bearing exercise. Follow.    Meningioma [facility]) Assessment & Plan: S/p removal of parietal tumor.  Followed by Dr Saturnino.  Evaluated 12/11/21.  MRI - maybe slight increase, but overall felt stable.  Recommended f/u in 2 years. Had recent head CT after fall. Results as outlined. She plans to call Dr Saturnino and have him compare and see about follow up.    Left knee pain, unspecified chronicity Assessment & Plan: Planning for surgery. Has been postponed.    History of colonic polyps Assessment & Plan: Had colonoscopy 01/2021.       Allena Hamilton, MD

## 2024-09-23 NOTE — Assessment & Plan Note (Signed)
 S/p removal of parietal tumor.  Followed by Dr Saturnino.  Evaluated 12/11/21.  MRI - maybe slight increase, but overall felt stable.  Recommended f/u in 2 years. Had recent head CT after fall. Results as outlined. She plans to call Dr Saturnino and have him compare and see about follow up.

## 2024-09-23 NOTE — Assessment & Plan Note (Signed)
 Planning for surgery. Has been postponed.

## 2024-09-23 NOTE — Assessment & Plan Note (Signed)
 Continue fosamax, calcium and vitamin D and weight bearing exercise. Follow.

## 2024-09-23 NOTE — Assessment & Plan Note (Signed)
 The 10-year ASCVD risk score (Arnett DK, et al., 2019) is: 6.3%   Values used to calculate the score:     Age: 68 years     Clincally relevant sex: Female     Is Non-Hispanic African American: No     Diabetic: No     Tobacco smoker: No     Systolic Blood Pressure: 124 mmHg     Is BP treated: No     HDL Cholesterol: 63.7 mg/dL     Total Cholesterol: 206 mg/dL  Low cholesterol diet and exercise. Follow lipid panel.

## 2024-09-27 DIAGNOSIS — E041 Nontoxic single thyroid nodule: Secondary | ICD-10-CM | POA: Diagnosis not present

## 2024-10-07 ENCOUNTER — Encounter: Payer: Self-pay | Admitting: Internal Medicine

## 2024-10-07 NOTE — Telephone Encounter (Signed)
 Please hold until we get the form.

## 2024-10-12 ENCOUNTER — Encounter: Payer: Self-pay | Admitting: Internal Medicine

## 2024-10-12 DIAGNOSIS — M5416 Radiculopathy, lumbar region: Secondary | ICD-10-CM | POA: Diagnosis not present

## 2024-10-12 NOTE — Telephone Encounter (Signed)
 Forms received in stack of paperwork to be completed

## 2024-10-13 NOTE — Telephone Encounter (Signed)
 Called and spoke to Ms [place]. No chest pain or sob.  No cough or congestion. Had calcium score 2024 - 0.  She is able to exercise, go up and down stairs and vacuum with no symptoms - no chest pain or sob.  Low risk to proceed with surgery. Is not taking fish oil. Off celebrex. Does not take aspirin. Form Signed and placed in box.

## 2024-10-13 NOTE — Telephone Encounter (Signed)
 Noted forms faxed to [facility]

## 2024-10-26 DIAGNOSIS — M5416 Radiculopathy, lumbar region: Secondary | ICD-10-CM | POA: Diagnosis not present

## 2024-10-27 DIAGNOSIS — M5416 Radiculopathy, lumbar region: Secondary | ICD-10-CM | POA: Diagnosis not present

## 2024-11-01 DIAGNOSIS — M5416 Radiculopathy, lumbar region: Secondary | ICD-10-CM | POA: Diagnosis not present

## 2024-11-03 DIAGNOSIS — M5416 Radiculopathy, lumbar region: Secondary | ICD-10-CM | POA: Diagnosis not present

## 2024-11-05 DIAGNOSIS — M5416 Radiculopathy, lumbar region: Secondary | ICD-10-CM | POA: Diagnosis not present

## 2024-11-08 ENCOUNTER — Ambulatory Visit
Admission: RE | Admit: 2024-11-08 | Discharge: 2024-11-08 | Disposition: A | Discharge status: Home / Self Care | Source: Ambulatory Visit | Attending: Internal Medicine | Admitting: Internal Medicine

## 2024-11-08 DIAGNOSIS — Z1231 Encounter for screening mammogram for malignant neoplasm of breast: Secondary | ICD-10-CM | POA: Insufficient documentation

## 2024-11-14 ENCOUNTER — Other Ambulatory Visit: Payer: Self-pay | Admitting: Internal Medicine

## 2024-11-15 DIAGNOSIS — D485 Neoplasm of uncertain behavior of skin: Secondary | ICD-10-CM | POA: Diagnosis not present

## 2024-11-15 DIAGNOSIS — M170 Bilateral primary osteoarthritis of knee: Secondary | ICD-10-CM | POA: Diagnosis not present

## 2024-11-15 DIAGNOSIS — L570 Actinic keratosis: Secondary | ICD-10-CM | POA: Diagnosis not present

## 2024-11-15 DIAGNOSIS — L821 Other seborrheic keratosis: Secondary | ICD-10-CM | POA: Diagnosis not present

## 2024-11-19 ENCOUNTER — Ambulatory Visit: Admitting: "Endocrinology

## 2024-12-29 HISTORY — PX: REPLACEMENT TOTAL KNEE: SUR1224

## 2025-01-04 ENCOUNTER — Ambulatory Visit (INDEPENDENT_AMBULATORY_CARE_PROVIDER_SITE_OTHER): Admitting: *Deleted

## 2025-01-04 VITALS — Ht 65.0 in | Wt 150.0 lb

## 2025-01-04 DIAGNOSIS — Z0000 Encounter for general adult medical examination without abnormal findings: Secondary | ICD-10-CM | POA: Diagnosis not present

## 2025-01-04 NOTE — Patient Instructions (Signed)
 Carla Horne,  Thank you for taking the time for your Medicare Wellness Visit. I appreciate your continued commitment to your health goals. Please review the care plan we discussed, and feel free to reach out if I can assist you further.  Please note that Annual Wellness Visits do not include a physical exam. Some assessments may be limited, especially if the visit was conducted virtually. If needed, we may recommend an in-person follow-up with your provider.  Ongoing Care Seeing your primary care provider every 3 to 6 months helps us  monitor your health and provide consistent, personalized care.  Remember to check on your second shingles vaccine.   Referrals If a referral was made during today's visit and you haven't received any updates within two weeks, please contact the referred provider directly to check on the status.  Recommended Screenings:  Health Maintenance  Topic Date Due   Zoster (Shingles) Vaccine (2 of 2) 10/22/2017   COVID-19 Vaccine (3 - Pfizer risk series) 10/22/2020   Flu Shot  03/15/2025*   Pneumococcal Vaccine for age over 41 (1 of 1 - PCV) 09/23/2025*   Breast Cancer Screening  11/08/2025   Medicare Annual Wellness Visit  01/04/2026   DTaP/Tdap/Td vaccine (2 - Td or Tdap) 08/28/2027   Colon Cancer Screening  02/01/2031   Osteoporosis screening with Bone Density Scan  Completed   Hepatitis C Screening  Completed   Meningitis B Vaccine  Aged Out  *Topic was postponed. The date shown is not the original due date.       01/04/2025    9:01 AM  Advanced Directives  Does Patient Have a Medical Advance Directive? Yes  Type of Estate Agent of [place];Living will  Does patient want to make changes to medical advance directive? No - Patient declined  Copy of Healthcare Power of Attorney in Chart? No - copy requested    Vision: Annual vision screenings are recommended for early detection of glaucoma, cataracts, and diabetic retinopathy. These  exams can also reveal signs of chronic conditions such as diabetes and high blood pressure.  Dental: Annual dental screenings help detect early signs of oral cancer, gum disease, and other conditions linked to overall health, including heart disease and diabetes.  Please see the attached documents for additional preventive care recommendations.

## 2025-01-04 NOTE — Progress Notes (Signed)
 "  Chief Complaint  Patient presents with   Medicare Wellness     Subjective:   Carla Horne is a 69 y.o. female who presents for a Medicare Annual Wellness Visit.  Visit info / Clinical Intake: Medicare Wellness Visit Type:: Subsequent Annual Wellness Visit Persons participating in visit and providing information:: patient Medicare Wellness Visit Mode:: Telephone If telephone:: video declined Since this visit was completed virtually, some vitals may be partially provided or unavailable. Missing vitals are due to the limitations of the virtual format.: Unable to obtain vitals - no equipment If Telephone or Video please confirm:: I connected with patient using audio/video enable telemedicine. I verified patient identity with two identifiers, discussed telehealth limitations, and patient agreed to proceed. Patient Location:: Home Provider Location:: Office/Home Interpreter Needed?: No Pre-visit prep was completed: yes AWV questionnaire completed by patient prior to visit?: no Living arrangements:: lives with spouse/significant other Patient's Overall Health Status Rating: very good Typical amount of pain: some (recovering from knee surgery) Does pain affect daily life?: (!) yes (just had knee surgery) Are you currently prescribed opioids?: no  Dietary Habits and Nutritional Risks How many meals a day?: 3 Eats fruit and vegetables daily?: yes Most meals are obtained by: preparing own meals In the last 2 weeks, have you had any of the following?: (!) nausea, vomiting, diarrhea (nausea from pain medication, better now) Diabetic:: no  Functional Status Activities of Daily Living (to include ambulation/medication): Independent Ambulation: Independent with device- listed below Home Assistive Devices/Equipment: Walker (specify Type) (recent knee surgery) Medication Administration: Independent Home Management (perform basic housework or laundry): Independent Manage your own finances?:  yes Primary transportation is: driving Concerns about vision?: no *vision screening is required for WTM* Concerns about hearing?: no  Fall Screening Falls in the past year?: 0 Number of falls in past year: 0 Was there an injury with Fall?: 0 Fall Risk Category Calculator: 0 Patient Fall Risk Level: Low Fall Risk  Fall Risk Patient at Risk for Falls Due to: No Fall Risks Fall risk Follow up: Falls evaluation completed; Falls prevention discussed  Home and Transportation Safety: All rugs have non-skid backing?: (!) no All stairs or steps have railings?: N/A, no stairs Grab bars in the bathtub or shower?: yes Have non-skid surface in bathtub or shower?: yes Good home lighting?: yes Regular seat belt use?: yes Hospital stays in the last year:: no  Cognitive Assessment Difficulty concentrating, remembering, or making decisions? : no Will 6CIT or Mini Cog be Completed: yes What year is it?: 0 points What month is it?: 0 points Give patient an address phrase to remember (5 components): [address] [place] About what time is it?: 0 points Count backwards from 20 to 1: 0 points Say the months of the year in reverse: 0 points Repeat the address phrase from earlier: 0 points 6 CIT Score: 0 points  Advance Directives (For Healthcare) Does Patient Have a Medical Advance Directive?: Yes Does patient want to make changes to medical advance directive?: No - Patient declined Type of Advance Directive: Healthcare Power of [place]; Living will Copy of Healthcare Power of Attorney in Chart?: No - copy requested Copy of Living Will in Chart?: No - copy requested  Reviewed/Updated  Reviewed/Updated: Reviewed All (Medical, Surgical, Family, Medications, Allergies, Care Teams, Patient Goals)    Allergies (verified) Prednisone    Current Medications (verified) Outpatient Encounter Medications as of 01/04/2025  Medication Sig   acetaminophen  (TYLENOL ) 500 MG tablet Take 500 mg by  mouth every 6 (six) hours as needed.   alendronate  (FOSAMAX ) 70 MG tablet TAKE 1 TABLET (70 MG TOTAL) BY MOUTH EVERY 7 DAYS WITH FULL GLASS WATER ON EMPTY STOMACH   NON FORMULARY Vitamin Code Raw calcium , take four by mouth daily   celecoxib (CELEBREX) 200 MG capsule Take 200 mg by mouth daily as needed. (Patient not taking: Reported on 01/04/2025)   Omega-3 Fatty Acids (FISH OIL) 1000 MG CAPS Take by mouth. Take one by mouth daily (Patient not taking: Reported on 01/04/2025)   No facility-administered encounter medications on file as of 01/04/2025.    History: Past Medical History:  Diagnosis Date   Cancer (HCC)    melanoma- leg   Meningioma (HCC)    Past Surgical History:  Procedure Laterality Date   Bladder Mesh Implant..1990     BREAST BIOPSY Left 20+ yrs ago   core - neg   BREAST SURGERY  1990   breast biopsy   KNEE SURGERY Right 06/2022   ORIF WRIST FRACTURE Left 04/03/2021   Procedure: OPEN REDUCTION INTERNAL FIXATION (ORIF) WRIST FRACTURE, left distal radius;  Surgeon: Edie Norleen PARAS, MD;  Location: [facility] ORS;  Service: Orthopedics;  Laterality: Left;   Remval of mengionoma  2010   REPLACEMENT TOTAL KNEE Left 12/29/2024   Family History  Problem Relation Age of Onset   Hypercholesterolemia Father    Hypertension Father    Breast cancer Paternal Grandmother    Colon cancer Neg Hx    Social History   Occupational History   Occupation: retired runner, broadcasting/film/video  Tobacco Use   Smoking status: Never   Smokeless tobacco: Never  Substance and Sexual Activity   Alcohol use: No    Alcohol/week: 0.0 standard drinks of alcohol   Drug use: No   Sexual activity: Not on file    Comment: LMP 2010   Tobacco Counseling Counseling given: Not Answered  SDOH Screenings   Food Insecurity: No Food Insecurity (01/04/2025)  Housing: Low Risk (01/04/2025)  Transportation Needs: No Transportation Needs (01/04/2025)  Utilities: Not At Risk (01/04/2025)  Alcohol Screen: Low Risk (01/04/2025)   Depression (PHQ2-9): Low Risk (01/04/2025)  Financial Resource Strain: Low Risk (01/04/2025)  Physical Activity: Sufficiently Active (01/04/2025)  Social Connections: Moderately Integrated (01/04/2025)  Stress: No Stress Concern Present (01/04/2025)  Tobacco Use: Low Risk (01/04/2025)  Health Literacy: Adequate Health Literacy (01/04/2025)   See flowsheets for full screening details  Depression Screen PHQ 2 & 9 Depression Scale- Over the past 2 weeks, how often have you been bothered by any of the following problems? Little interest or pleasure in doing things: 0 Feeling down, depressed, or hopeless (PHQ Adolescent also includes...irritable): 0 PHQ-2 Total Score: 0 Trouble falling or staying asleep, or sleeping too much: 0 Feeling tired or having little energy: 0 Poor appetite or overeating (PHQ Adolescent also includes...weight loss): 1 Feeling bad about yourself - or that you are a failure or have let yourself or your family down: 0 Trouble concentrating on things, such as reading the newspaper or watching television (PHQ Adolescent also includes...like school work): 0 Moving or speaking so slowly that other people could have noticed. Or the opposite - being so fidgety or restless that you have been moving around a lot more than usual: 0 Thoughts that you would be better off dead, or of hurting yourself in some way: 0 PHQ-9 Total Score: 1 If you checked off any problems, how difficult have these problems made it for you to do your work,  take care of things at home, or get along with other people?: Not difficult at all     Goals Addressed             This Visit's Progress    Patient Stated       Wants to lose 15 pounds             Objective:    Today's Vitals   01/04/25 0856  Weight: 150 lb (68 kg)  Height: 5' 5 (1.651 m)   Body mass index is 24.96 kg/m.  Hearing/Vision screen Hearing Screening - Comments:: No issues Vision Screening - Comments:: Glasses, My Eye  Doctor, up to date Immunizations and Health Maintenance Health Maintenance  Topic Date Due   Zoster Vaccines- Shingrix (2 of 2) 10/22/2017   COVID-19 Vaccine (3 - Pfizer risk series) 10/22/2020   Influenza Vaccine  03/15/2025 (Originally 07/16/2024)   Pneumococcal Vaccine: 50+ Years (1 of 1 - PCV) 09/23/2025 (Originally 06/06/2007)   Mammogram  11/08/2025   Medicare Annual Wellness (AWV)  01/04/2026   DTaP/Tdap/Td (2 - Td or Tdap) 08/28/2027   Colonoscopy  02/01/2031   Bone Density Scan  Completed   Hepatitis C Screening  Completed   Meningococcal B Vaccine  Aged Out        Assessment/Plan:  This is a routine wellness examination for Sanda.  Patient Care Team: Glendia Shad, MD as PCP - General (Internal Medicine) Leora Lynwood SAUNDERS, MD as Referring Physician (Orthopedic Surgery) Dartha Ernst, MD as Consulting Physician (Endocrinology) Brendia Calton Squires, [place] (Endocrinology)  I have personally reviewed and noted the following in the patients chart:   Medical and social history Use of alcohol, tobacco or illicit drugs  Current medications and supplements including opioid prescriptions. Functional ability and status Nutritional status Physical activity Advanced directives List of other physicians Hospitalizations, surgeries, and ER visits in previous 12 months Vitals Screenings to include cognitive, depression, and falls Referrals and appointments  No orders of the defined types were placed in this encounter.  In addition, I have reviewed and discussed with patient certain preventive protocols, quality metrics, and best practice recommendations. A written personalized care plan for preventive services as well as general preventive health recommendations were provided to patient.   Angeline Fredericks, LPN   [id number]/[id number]   Return in 1 year (on 01/04/2026).  After Visit Summary: (MyChart) Due to this being a telephonic visit, the after visit summary with patients  personalized plan was offered to patient via MyChart   Nurse Notes: Patient declines flu and covid vaccines. Patient stated that she thinks she had her second shingles vaccine at [facility]. Unable to locate a date on [facility]. Patient will check on this and get information to PCP if she has any luck.  "

## 2025-03-24 ENCOUNTER — Other Ambulatory Visit

## 2025-03-29 ENCOUNTER — Encounter: Admitting: Internal Medicine

## 2026-01-09 ENCOUNTER — Ambulatory Visit
